# Patient Record
Sex: Female | Born: 1990 | Race: Black or African American | Hispanic: No | Marital: Single | Smoking: Current every day smoker
Health system: Southern US, Community
[De-identification: ages and names within clinical notes are randomized; demographics above are authoritative.]

## PROBLEM LIST (undated history)

## (undated) DIAGNOSIS — A5901 Trichomonal vulvovaginitis: Secondary | ICD-10-CM

## (undated) DIAGNOSIS — IMO0002 Reserved for concepts with insufficient information to code with codable children: Secondary | ICD-10-CM

## (undated) DIAGNOSIS — A546 Gonococcal infection of anus and rectum: Secondary | ICD-10-CM

## (undated) DIAGNOSIS — A749 Chlamydial infection, unspecified: Secondary | ICD-10-CM

## (undated) HISTORY — DX: Trichomonal vulvovaginitis: A59.01

## (undated) HISTORY — DX: Gonococcal infection of anus and rectum: A54.6

## (undated) HISTORY — DX: Chlamydial infection, unspecified: A74.9

## (undated) HISTORY — PX: CERVICAL CERCLAGE: SHX1329

## (undated) HISTORY — DX: Reserved for concepts with insufficient information to code with codable children: IMO0002

---
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
## 2010-08-13 DIAGNOSIS — A749 Chlamydial infection, unspecified: Secondary | ICD-10-CM

## 2010-08-13 HISTORY — DX: Chlamydial infection, unspecified: A74.9

## 2010-11-14 ENCOUNTER — Ambulatory Visit (INDEPENDENT_AMBULATORY_CARE_PROVIDER_SITE_OTHER): Payer: Medicaid Other | Admitting: Family Medicine

## 2010-11-14 ENCOUNTER — Other Ambulatory Visit: Payer: Self-pay | Admitting: Obstetrics & Gynecology

## 2010-11-14 ENCOUNTER — Encounter: Payer: Self-pay | Admitting: Gynecology

## 2010-11-14 VITALS — BP 102/70 | Wt 129.0 lb

## 2010-11-14 DIAGNOSIS — Z3400 Encounter for supervision of normal first pregnancy, unspecified trimester: Secondary | ICD-10-CM

## 2010-11-15 LAB — OBSTETRIC PANEL
Antibody Screen: NEGATIVE
Basophils Absolute: 0 10*3/uL (ref 0.0–0.1)
Basophils Relative: 0 % (ref 0–1)
Eosinophils Absolute: 0.1 10*3/uL (ref 0.0–0.7)
Eosinophils Relative: 1 % (ref 0–5)
HCT: 43.4 % (ref 36.0–46.0)
Hemoglobin: 14.5 g/dL (ref 12.0–15.0)
Hepatitis B Surface Ag: NEGATIVE
Lymphocytes Relative: 16 % (ref 12–46)
Lymphs Abs: 2.7 10*3/uL (ref 0.7–4.0)
MCH: 33.8 pg (ref 26.0–34.0)
MCHC: 33.4 g/dL (ref 30.0–36.0)
MCV: 101.2 fL — ABNORMAL HIGH (ref 78.0–100.0)
Monocytes Absolute: 0.7 10*3/uL (ref 0.1–1.0)
Monocytes Relative: 4 % (ref 3–12)
Neutro Abs: 13.5 10*3/uL — ABNORMAL HIGH (ref 1.7–7.7)
Neutrophils Relative %: 79 % — ABNORMAL HIGH (ref 43–77)
Platelets: 273 10*3/uL (ref 150–400)
RBC: 4.29 MIL/uL (ref 3.87–5.11)
RDW: 13.5 % (ref 11.5–15.5)
Rh Type: POSITIVE
Rubella: 24.3 IU/mL — ABNORMAL HIGH
WBC: 17.1 10*3/uL — ABNORMAL HIGH (ref 4.0–10.5)

## 2010-11-15 LAB — GC/CHLAMYDIA PROBE AMP, URINE
Chlamydia, Swab/Urine, PCR: NEGATIVE
GC Probe Amp, Urine: NEGATIVE

## 2010-11-15 LAB — SICKLE CELL SCREEN: Sickle Cell Screen: NEGATIVE

## 2010-11-15 LAB — HIV ANTIBODY (ROUTINE TESTING W REFLEX): HIV: NONREACTIVE

## 2010-11-16 LAB — URINE CULTURE: Colony Count: 6000

## 2010-11-19 ENCOUNTER — Ambulatory Visit (HOSPITAL_COMMUNITY)
Admission: RE | Admit: 2010-11-19 | Discharge: 2010-11-19 | Disposition: A | Discharge status: Home / Self Care | Payer: Medicaid Other | Source: Ambulatory Visit | Attending: Obstetrics & Gynecology | Admitting: Obstetrics & Gynecology

## 2010-11-19 DIAGNOSIS — Z3400 Encounter for supervision of normal first pregnancy, unspecified trimester: Secondary | ICD-10-CM

## 2010-11-19 DIAGNOSIS — Z3689 Encounter for other specified antenatal screening: Secondary | ICD-10-CM | POA: Insufficient documentation

## 2010-11-26 ENCOUNTER — Emergency Department (HOSPITAL_COMMUNITY)
Admission: EM | Admit: 2010-11-26 | Discharge: 2010-11-26 | Disposition: A | Discharge status: Home / Self Care | Payer: Medicaid Other | Attending: Emergency Medicine | Admitting: Emergency Medicine

## 2010-11-26 ENCOUNTER — Emergency Department (HOSPITAL_COMMUNITY): Payer: Medicaid Other

## 2010-11-26 DIAGNOSIS — M25559 Pain in unspecified hip: Secondary | ICD-10-CM | POA: Insufficient documentation

## 2010-11-26 DIAGNOSIS — O99891 Other specified diseases and conditions complicating pregnancy: Secondary | ICD-10-CM | POA: Insufficient documentation

## 2010-11-27 ENCOUNTER — Encounter: Payer: Self-pay | Admitting: Obstetrics & Gynecology

## 2010-11-27 ENCOUNTER — Other Ambulatory Visit: Payer: Self-pay | Admitting: Obstetrics & Gynecology

## 2010-11-27 ENCOUNTER — Ambulatory Visit (INDEPENDENT_AMBULATORY_CARE_PROVIDER_SITE_OTHER): Payer: Medicaid Other | Admitting: Obstetrics & Gynecology

## 2010-11-27 VITALS — BP 102/70 | Wt 127.5 lb

## 2010-11-27 DIAGNOSIS — A499 Bacterial infection, unspecified: Secondary | ICD-10-CM

## 2010-11-27 DIAGNOSIS — Z3400 Encounter for supervision of normal first pregnancy, unspecified trimester: Secondary | ICD-10-CM

## 2010-11-27 DIAGNOSIS — IMO0002 Reserved for concepts with insufficient information to code with codable children: Secondary | ICD-10-CM

## 2010-11-27 DIAGNOSIS — B9689 Other specified bacterial agents as the cause of diseases classified elsewhere: Secondary | ICD-10-CM

## 2010-11-27 DIAGNOSIS — Z3682 Encounter for antenatal screening for nuchal translucency: Secondary | ICD-10-CM

## 2010-11-27 DIAGNOSIS — N760 Acute vaginitis: Secondary | ICD-10-CM

## 2010-11-27 NOTE — Patient Instructions (Signed)
Pregnancy - First Trimester During sexual intercourse, millions of sperm go into the vagina. Only 1 sperm will penetrate and fertilize the female egg while it is in the Fallopian tube. One week later, the fertilized egg implants into the wall of the uterus. An embryo begins to develop into a baby. At 6 to 8 weeks, the eyes and face are formed and the heartbeat can be seen on ultrasound. At the end of 12 weeks (first trimester), all the baby's organs are formed. Now that you are pregnant, you will want to do everything you can to have a healthy baby. Two of the most important things are to get good prenatal care and follow your caregiver's instructions. Prenatal care is all the medical care you receive before the baby's birth. It is given to prevent, find and treat problems during the pregnancy and childbirth. PRENATAL EXAMS:  During prenatal visits, your weight, blood pressure and urine are checked. This is done to make sure you are healthy and progressing normally during the pregnancy.   A pregnant woman should gain 25 to 35 pounds during the pregnancy. However, if you are over weight or underweight, your caregiver will advise you regarding your weight.   Your caregiver will ask and answer questions for you.   Blood work, cervical cultures, other necessary tests and a Pap test are done during your prenatal exams. These tests are done to check on your health and the probable health of your baby. Tests are strongly recommended and done for HIV with your permission. This is the virus that causes AIDS. These tests are done because medications can be given to help prevent your baby from being born with this infection should you have been infected without knowing it. Blood work is also used to find out your blood type, previous infections and follow your blood levels (hemoglobin).   Low hemoglobin (anemia) is common during pregnancy. Iron and vitamins are given to help prevent this. Later in the pregnancy,  blood tests for diabetes will be done along with any other tests if any problems develop. You may need tests to make sure you and the baby are doing well.   You may need other tests to make sure you and the baby are doing well.  CHANGES DURING THE FIRST TRIMESTER (THE FIRST 3 MONTHS OF PREGNANCY) Your body goes through many changes during pregnancy. They vary from person to person. Talk to your caregiver about changes you notice and are concerned about. Changes can include:  Your menstrual period stops.   The egg and sperm carry the genes that determine what you look like. Genes from you and your partner are forming a baby. The female genes determine whether the baby is a boy or a girl.   Your body increases in girth and you may feel bloated.   Feeling sick to your stomach (nauseous) and throwing up (vomiting). If the vomiting is uncontrollable, call your caregiver.   Your breasts will begin to enlarge and become tender.   Your nipples may stick out more and become darker.   The need to urinate more. Painful urination may mean you have a bladder infection.   Tiring easily.   Loss of appetite.   Cravings for certain kinds of food.   At first, you may gain or lose a couple of pounds.   You may have changes in your emotions from day to day (excited to be pregnant or concerned something may go wrong with the pregnancy and baby).     You may have more vivid and strange dreams.  HOME CARE INSTRUCTIONS  It is very important to avoid all smoking, alcohol and un-prescribed drugs during your pregnancy. These affect the formation and growth of the baby. Avoid chemicals while pregnant to ensure the delivery of a healthy infant.   Start your prenatal visits by the 12th week of pregnancy. They are usually scheduled monthly at first, then more often in the last 2 months before delivery. Keep your caregiver's appointments. Follow your caregiver's instructions regarding medication use, blood and lab  tests, exercise, and diet.   During pregnancy, you are providing food for you and your baby. Eat regular, well-balanced meals. Choose foods such as meat, fish, milk and other low fat dairy products, vegetables, fruits, and whole-grain breads and cereals. Your caregiver will tell you of the ideal weight gain.   You can help morning sickness by keeping soda crackers (saltines) at the bedside. Eat a couple before arising in the morning. You may want to use the crackers without salt on them.   Eating 4 to 5 small meals rather than 3 large meals a day also may help the nausea and vomiting.   Drinking liquids between meals instead of during meals also seems to help nausea and vomiting.   A physical sexual relationship may be continued throughout pregnancy if there are no other problems. Problems may be early (premature) leaking of amniotic fluid from the membranes, vaginal bleeding, or belly (abdominal) pain.   Exercise regularly if there are no restrictions. Check with your caregiver or physical therapist if you are unsure of the safety of some of your exercises. Greater weight gain will occur in the last 2 trimesters of pregnancy. Exercising will help:   Control your weight.   Keep you in shape.   Prepare you for labor and delivery.   Help you lose your pregnancy weight after you deliver your baby.   Wear a good support or jogging bra for breast tenderness during pregnancy. This may help if worn during sleep too.   Ask when prenatal classes are available. Begin classes when they are offered.   Do not use hot tubs, steam rooms or saunas.   Wear your seat belt when driving. This protects you and your baby if you are in an accident.   Avoid raw meat, uncooked cheese, cat litter boxes and soil used by cats throughout the pregnancy. These carry germs that can cause birth defects in the baby.   The first trimester is a good time to visit your dentist for your dental health. Getting your teeth  cleaned is OK. Use a softer toothbrush and brush gently during pregnancy.   Ask for help if you have financial, counseling or nutritional needs during pregnancy. Your caregiver will be able to offer counseling for these needs as well as refer you for other special needs.   Do not take any medications or herbs unless told by your caregiver.   Inform your caregiver if there is any mental or physical domestic violence.   Make a list of emergency phone numbers of family, friends, hospital, police and fire department.   Write down your questions. Take them to your prenatal visit.   Do not douche.   Do not cross your legs.   If you have to stand for long periods of time, rotate you feet or take small steps in a circle.   You may have more vaginal secretions that may require a sanitary pad. Do not use tampons   or scented sanitary pads.  MEDICATIONS AND DRUG USE IN PREGNANCY  Take prenatal vitamins as directed. The vitamin should contain 1 milligram of folic acid. Keep all vitamins out of reach of children. Only a couple vitamins or tablets containing iron may be fatal to a baby or young child when ingested.   Avoid use of all medications, including herbs, over-the-counter medications, not prescribed or suggested by your caregiver. Only take over-the-counter or prescription medicines for pain, discomfort, or fever as directed by your caregiver. Do not use aspirin, ibuprofen (Motrin, Advil, Nuprin) or naproxen (Aleve) unless OK'd by your caregiver.   Let your caregiver also know about herbs you may be using.   Alcohol is related to a number of birth defects. This includes fetal alcohol syndrome. All alcohol, in any form, should be avoided completely. Smoking will cause low birth rate and premature babies.   Street/illegal drugs are very harmful to the baby. They are absolutely forbidden. A baby born to an addicted mother will be addicted at birth. The baby will go through the same withdrawal  an adult does.   Let your caregiver know about any medications that you have to take and for what reason you take them.  MISCARRIAGE IS COMMON DURING PREGNANCY A miscarriage does not mean you did something wrong. It is not a reason to worry about getting pregnant again. Your caregiver will help you with questions you may have. If you have a miscarriage, you may need minor surgery (a D & C). SEEK MEDICAL CARE IF:  You have any concerns or worries during your pregnancy. It is better to call with your questions if you feel they cannot wait, rather than worry about them.  SEEK IMMEDIATE MEDICAL CARE IF:  An unexplained oral temperature above 100.4 develops, or as your caregiver suggests.   You have leaking of fluid from the vagina (birth canal). If leaking membranes are suspected, take your temperature and inform your caregiver of this when you call.   There is vaginal spotting or bleeding. Notify your caregiver of the amount and how many pads are used.   You develop a bad smelling vaginal discharge with a change in the color.   You continue to feel sick to your stomach (nauseated) and have no relief from remedies suggested. You vomit blood or coffee ground like materials.   You lose more than 2 pounds of weight in one week.   You gain more than 2 pounds of weight in a week and you notice swelling of your face, hands, feet or legs.   You gain 5 pounds or more in 1 week (even if you do not have swelling of your hands, face, legs or feet).   You get exposed to German measles and have never had them.   You are exposed to fifth disease or chicken pox.   You develop belly (abdominal) pain. Round ligament discomfort is a common non-cancerous (benign) cause of abdominal pain in pregnancy. Your caregiver still must evaluate this.   You develop headache, fever, diarrhea, pain with urination, or shortness of breath.   You fall, are in a car accident or have any kind of trauma.   There is mental  or physical violence in your home.  Document Released: 03/25/2001 Document Re-Released: 09/18/2009 [employer] Patient Information 2011 [employer]. Pregnancy - Second Trimester The second trimester of pregnancy (3 to 6 months) is a period of rapid growth for you and your baby. At the end of the sixth month, your   baby is about 9 inches long and weighs 1 1/2 pounds. You will begin to feel the baby move between 18 and 20 weeks of the pregnancy. This is called quickening. Weight gain is faster. A clear fluid (colostrum) may leak out of your breasts. You may feel small contractions of the womb (uterus). This is known as false labor or Braxton-Hicks contractions. This is like a practice for labor when the baby is ready to be born. Usually, the problems with morning sickness have usually passed by the end of your first trimester. Some women develop small dark blotches (called cholasma, mask of pregnancy) on their face that usually goes away after the baby is born. Exposure to the sun makes the blotches worse. Acne may also develop in some pregnant women and pregnant women who have acne, may find that it goes away. PRENATAL EXAMS  Blood work may continue to be done during prenatal exams. These tests are done to check on your health and the probable health of your baby. Blood work is used to follow your blood levels (hemoglobin). Anemia (low hemoglobin) is common during pregnancy. Iron and vitamins are given to help prevent this. You will also be checked for diabetes between 24 and 28 weeks of the pregnancy. Some of the previous blood tests may be repeated.   The size of the uterus is measured during each visit. This is to make sure that the baby is continuing to grow properly according to the dates of the pregnancy.   Your blood pressure is checked every prenatal visit. This is to make sure you are not getting toxemia.   Your urine is checked to make sure you do not have an infection, diabetes or protein in  the urine.   Your weight is checked often to make sure gains are happening at the suggested rate. This is to ensure that both you and your baby are growing normally.   Sometimes, an ultrasound is performed to confirm the proper growth and development of the baby. This is a test which bounces harmless sound waves off the baby so your caregiver can more accurately determine due dates.  Sometimes, a specialized test is done on the amniotic fluid surrounding the baby. This test is called an amniocentesis. The amniotic fluid is obtained by sticking a needle into the belly (abdomen). This is done to check the chromosomes in instances where there is a concern about possible genetic problems with the baby. It is also sometimes done near the end of pregnancy if an early delivery is required. In this case, it is done to help make sure the baby's lungs are mature enough for the baby to live outside of the womb. CHANGES OCCURING IN THE SECOND TRIMESTER OF PREGNANCY Your body goes through many changes during pregnancy. They vary from person to person. Talk to your caregiver about changes you notice that you are concerned about.  During the second trimester, you will likely have an increase in your appetite. It is normal to have cravings for certain foods. This varies from person to person and pregnancy to pregnancy.   Your lower abdomen will begin to bulge.   You may have to urinate more often because the uterus and baby are pressing on your bladder. It is also common to get more bladder infections during pregnancy (pain with urination). You can help this by drinking lots of fluids and emptying your bladder before and after intercourse.   You may begin to get stretch marks on your hips,   abdomen, and breasts. These are normal changes in the body during pregnancy. There are no exercises or medications to take that prevent this change.   You may begin to develop swollen and bulging veins (varicose veins) in your  legs. Wearing support hose, elevating your feet for 15 minutes, 3 to 4 times a day and limiting salt in your diet helps lessen the problem.   Heartburn may develop as the uterus grows and pushes up against the stomach. Antacids recommended by your caregiver helps with this problem. Also, eating smaller meals 4 to 5 times a day helps.   Constipation can be treated with a stool softener or adding bulk to your diet. Drinking lots of fluids, vegetables, fruits, and whole grains are helpful.   Exercising is also helpful. If you have been very active up until your pregnancy, most of these activities can be continued during your pregnancy. If you have been less active, it is helpful to start an exercise program such as walking.   Hemorrhoids (varicose veins in the rectum) may develop at the end of the second trimester. Warm sitz baths and hemorrhoid cream recommended by your caregiver helps hemorrhoid problems.   Backaches may develop during this time of your pregnancy. Avoid heavy lifting, wear low heal shoes and practice good posture to help with backache problems.   Some pregnant women develop tingling and numbness of their hand and fingers because of swelling and tightening of ligaments in the wrist (carpel tunnel syndrome). This goes away after the baby is born.   As your breasts enlarge, you may have to get a bigger bra. Get a comfortable, cotton, support bra. Do not get a nursing bra until the last month of the pregnancy if you will be nursing the baby.   You may get a dark line from your belly button to the pubic area called the linea nigra.   You may develop rosy cheeks because of increase blood flow to the face.   You may develop spider looking lines of the face, neck, arms and chest. These go away after the baby is born.  HOME CARE INSTRUCTIONS  It is extremely important to avoid all smoking, herbs, alcohol, and un-prescribed drugs during your pregnancy. These chemicals affect the  formation and growth of the baby. Avoid these chemicals throughout the pregnancy to ensure the delivery of a healthy infant.   Most of your home care instructions are the same as suggested for the first trimester of your pregnancy. Keep your caregiver's appointments. Follow your caregiver's instructions regarding medication use, exercise and diet.   During pregnancy, you are providing food for you and your baby. Continue to eat regular, well-balanced meals. Choose foods such as meat, fish, milk and other low fat dairy products, vegetables, fruits, and whole-grain breads and cereals. Your caregiver will tell you of the ideal weight gain.   A physical sexual relationship may be continued up until near the end of pregnancy if there are no other problems. Problems could include early (premature) leaking of amniotic fluid from the membranes, vaginal bleeding, abdominal pain, or other medical or pregnancy problems.   Exercise regularly if there are no restrictions. Check with your caregiver if you are unsure of the safety of some of your exercises. The greatest weight gain will occur in the last 2 trimesters of pregnancy. Exercise will help you:   Control your weight.   Get you in shape for labor and delivery.   Lose weight after you have the baby.     Wear a good support or jogging bra for breast tenderness during pregnancy. This may help if worn during sleep. Pads or tissues may be used in the bra if you are leaking colostrum.   Do not use hot tubs, steam rooms or saunas throughout the pregnancy.   Wear your seat belt at all times when driving. This protects you and your baby if you are in an accident.   Avoid raw meat, uncooked cheese, cat litter boxes and soil used by cats. These carry germs that can cause birth defects in the baby.   The second trimester is also a good time to visit your dentist for your dental health if this has not been done yet. Getting your teeth cleaned is OK. Use a soft  toothbrush. Brush gently during pregnancy.   It is easier to loose urine during pregnancy. Tightening up and strengthening the pelvic muscles will help with this problem. Practice stopping your urination while you are going to the bathroom. These are the same muscles you need to strengthen. It is also the muscles you would use as if you were trying to stop from passing gas. You can practice tightening these muscles up 10 times a set and repeating this about 3 times per day. Once you know what muscles to tighten up, do not perform these exercises during urination. It is more likely to contribute to an infection by backing up the urine.   Ask for help if you have financial, counseling or nutritional needs during pregnancy. Your caregiver will be able to offer counseling for these needs as well as refer you for other special needs.   Your skin may become oily. If so, wash your face with mild soap, use non-greasy moisturizer and oil or cream based makeup.  MEDICATIONS AND DRUG USE IN PREGNANCY  Take prenatal vitamins as directed. The vitamin should contain 1 milligram of folic acid. Keep all vitamins out of reach of children. Only a couple vitamins or tablets containing iron may be fatal to a baby or young child when ingested.   Avoid use of all medications, including herbs, over-the-counter medications, not prescribed or suggested by your caregiver. Only take over-the-counter or prescription medicines for pain, discomfort, or fever as directed by your caregiver. Do not use aspirin.   Let your caregiver also know about herbs you may be using.   Alcohol is related to a number of birth defects. This includes fetal alcohol syndrome. All alcohol, in any form, should be avoided completely. Smoking will cause low birth rate and premature babies.   Street/illegal drugs are very harmful to the baby. They are absolutely forbidden. A baby born to an addicted mother will be addicted at birth. The baby will go  through the same withdrawal an adult does.  SEEK MEDICAL CARE IF:  You have any concerns or worries during your pregnancy. It is better to call with your questions if you feel they cannot wait, rather than worry about them.  SEEK IMMEDIATE MEDICAL CARE IF:  An unexplained oral temperature above 100.4 develops, or as your caregiver suggests.   You have leaking of fluid from the vagina (birth canal). If leaking membranes are suspected, take your temperature and tell your caregiver of this when you call.   There is vaginal spotting, bleeding, or passing clots. Tell your caregiver of the amount and how many pads are used. Light spotting in pregnancy is common, especially following intercourse.   You develop a bad smelling vaginal discharge with a change   in the color from clear to white.   You continue to feel sick to your stomach (nauseated) and have no relief from remedies suggested. You vomit blood or coffee ground like materials.   You loose more than 2 pounds of weight or gain more than 2 pounds of weight over a weeks time, or as suggested by your caregiver.   You notice swelling of your face, hands, and feet or legs.   You get exposed to German measles and have never had them.   You are exposed to fifth disease or chicken pox.   You develop belly (abdominal) pain. Round ligament discomfort is a common non-cancerous (benign) cause of abdominal pain in pregnancy. Your caregiver still must evaluate you.   You develop a bad headache that does not go away.   You develop fever, diarrhea, pain with urination or shortness of breath.   You develop visual problems, blurry or double vision.   You fall, are in a car accident or any kind of trauma.   There is mental or physical violence at home.  Document Released: 03/25/2001 Document Re-Released: 06/25/2009 [employer] Patient Information 2011 [employer]. 
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
## 2010-11-28 LAB — GC/CHLAMYDIA PROBE AMP, GENITAL
Chlamydia, DNA Probe: NEGATIVE
GC Probe Amp, Genital: NEGATIVE

## 2010-11-28 LAB — WET PREP, GENITAL

## 2010-11-28 MED ORDER — METRONIDAZOLE 500 MG PO TABS: 500.0000 mg | ORAL_TABLET | Freq: Two times a day (BID) | ORAL | Status: AC

## 2010-11-28 NOTE — Progress Notes (Signed)
Addended by: Jaynie Collins A on: 11/28/2010 02:17 PM   Modules accepted: Orders

## 2010-11-29 ENCOUNTER — Ambulatory Visit (HOSPITAL_COMMUNITY)
Admission: RE | Admit: 2010-11-29 | Discharge: 2010-11-29 | Disposition: A | Discharge status: Home / Self Care | Payer: Medicaid Other | Source: Ambulatory Visit | Attending: Obstetrics & Gynecology | Admitting: Obstetrics & Gynecology

## 2010-11-29 ENCOUNTER — Encounter (HOSPITAL_COMMUNITY): Payer: Self-pay

## 2010-11-29 ENCOUNTER — Other Ambulatory Visit: Payer: Self-pay | Admitting: Obstetrics & Gynecology

## 2010-11-29 ENCOUNTER — Ambulatory Visit (HOSPITAL_COMMUNITY): Admission: RE | Admit: 2010-11-29 | Payer: Medicaid Other | Source: Ambulatory Visit

## 2010-11-29 DIAGNOSIS — O351XX0 Maternal care for (suspected) chromosomal abnormality in fetus, not applicable or unspecified: Secondary | ICD-10-CM | POA: Insufficient documentation

## 2010-11-29 DIAGNOSIS — O3510X0 Maternal care for (suspected) chromosomal abnormality in fetus, unspecified, not applicable or unspecified: Secondary | ICD-10-CM | POA: Insufficient documentation

## 2010-11-29 DIAGNOSIS — Z3682 Encounter for antenatal screening for nuchal translucency: Secondary | ICD-10-CM

## 2010-11-29 DIAGNOSIS — Z3689 Encounter for other specified antenatal screening: Secondary | ICD-10-CM | POA: Insufficient documentation

## 2010-11-29 NOTE — Progress Notes (Signed)
Patient seen for ultrasound only appointment today.  Please see [facility]-OBGYN report for details.  

## 2010-12-03 ENCOUNTER — Other Ambulatory Visit: Payer: Self-pay | Admitting: Maternal and Fetal Medicine

## 2010-12-03 ENCOUNTER — Ambulatory Visit (HOSPITAL_COMMUNITY)
Admission: RE | Admit: 2010-12-03 | Discharge: 2010-12-03 | Disposition: A | Discharge status: Home / Self Care | Payer: Medicaid Other | Source: Ambulatory Visit | Attending: Obstetrics & Gynecology | Admitting: Obstetrics & Gynecology

## 2010-12-03 DIAGNOSIS — O3510X0 Maternal care for (suspected) chromosomal abnormality in fetus, unspecified, not applicable or unspecified: Secondary | ICD-10-CM | POA: Insufficient documentation

## 2010-12-03 DIAGNOSIS — O351XX0 Maternal care for (suspected) chromosomal abnormality in fetus, not applicable or unspecified: Secondary | ICD-10-CM | POA: Insufficient documentation

## 2010-12-03 DIAGNOSIS — Z3682 Encounter for antenatal screening for nuchal translucency: Secondary | ICD-10-CM

## 2010-12-03 NOTE — Progress Notes (Signed)
Patient seen for ultrasound only appointment today.  Please see [facility]-OBGYN report for details.  

## 2010-12-09 ENCOUNTER — Inpatient Hospital Stay (HOSPITAL_COMMUNITY)
Admission: AD | Admit: 2010-12-09 | Discharge: 2010-12-09 | Disposition: A | Discharge status: Home / Self Care | Payer: Medicaid Other | Source: Ambulatory Visit | Attending: Obstetrics & Gynecology | Admitting: Obstetrics & Gynecology

## 2010-12-09 ENCOUNTER — Encounter (HOSPITAL_COMMUNITY): Payer: Self-pay

## 2010-12-09 DIAGNOSIS — O2390 Unspecified genitourinary tract infection in pregnancy, unspecified trimester: Secondary | ICD-10-CM | POA: Insufficient documentation

## 2010-12-09 DIAGNOSIS — IMO0002 Reserved for concepts with insufficient information to code with codable children: Secondary | ICD-10-CM

## 2010-12-09 DIAGNOSIS — O98919 Unspecified maternal infectious and parasitic disease complicating pregnancy, unspecified trimester: Secondary | ICD-10-CM

## 2010-12-09 DIAGNOSIS — N72 Inflammatory disease of cervix uteri: Secondary | ICD-10-CM | POA: Insufficient documentation

## 2010-12-09 LAB — URINALYSIS, ROUTINE W REFLEX MICROSCOPIC
Glucose, UA: NEGATIVE mg/dL
Ketones, ur: NEGATIVE mg/dL
Leukocytes, UA: NEGATIVE
Protein, ur: NEGATIVE mg/dL
Urobilinogen, UA: 0.2 mg/dL (ref 0.0–1.0)

## 2010-12-09 LAB — WET PREP, GENITAL: Trich, Wet Prep: NONE SEEN

## 2010-12-09 MED ORDER — AZITHROMYCIN 250 MG PO TABS
1000.0000 mg | ORAL_TABLET | Freq: Once | ORAL | Status: AC
  Administered 2010-12-09: 1000 mg via ORAL
  Filled 2010-12-09: qty 4

## 2010-12-09 NOTE — Progress Notes (Signed)
Pt states she has been having vaginal discomfort since 8-22. Slight yellow vaginal d/c.
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
## 2010-12-31 ENCOUNTER — Encounter: Payer: Medicaid Other | Admitting: Obstetrics & Gynecology

## 2010-12-31 ENCOUNTER — Ambulatory Visit (INDEPENDENT_AMBULATORY_CARE_PROVIDER_SITE_OTHER): Payer: Medicaid Other | Admitting: Obstetrics & Gynecology

## 2010-12-31 VITALS — BP 100/66 | Wt 132.0 lb

## 2010-12-31 DIAGNOSIS — Z3480 Encounter for supervision of other normal pregnancy, unspecified trimester: Secondary | ICD-10-CM

## 2010-12-31 NOTE — Progress Notes (Signed)
No complaints.  Needs pregnancy work restrictions letter; letter given to patient.  Anatomy scan to be scheduled.  RTC 4 weeks.

## 2011-01-07 ENCOUNTER — Other Ambulatory Visit: Payer: Self-pay | Admitting: Obstetrics & Gynecology

## 2011-01-07 ENCOUNTER — Encounter: Payer: Self-pay | Admitting: Obstetrics & Gynecology

## 2011-01-07 ENCOUNTER — Ambulatory Visit (HOSPITAL_COMMUNITY)
Admission: RE | Admit: 2011-01-07 | Discharge: 2011-01-07 | Disposition: A | Discharge status: Home / Self Care | Payer: Medicaid Other | Source: Ambulatory Visit | Attending: Obstetrics & Gynecology | Admitting: Obstetrics & Gynecology

## 2011-01-07 DIAGNOSIS — Z1389 Encounter for screening for other disorder: Secondary | ICD-10-CM | POA: Insufficient documentation

## 2011-01-07 DIAGNOSIS — Z3480 Encounter for supervision of other normal pregnancy, unspecified trimester: Secondary | ICD-10-CM

## 2011-01-07 DIAGNOSIS — Z363 Encounter for antenatal screening for malformations: Secondary | ICD-10-CM | POA: Insufficient documentation

## 2011-01-07 DIAGNOSIS — O26879 Cervical shortening, unspecified trimester: Secondary | ICD-10-CM | POA: Insufficient documentation

## 2011-01-07 DIAGNOSIS — O358XX0 Maternal care for other (suspected) fetal abnormality and damage, not applicable or unspecified: Secondary | ICD-10-CM | POA: Insufficient documentation

## 2011-01-07 MED ORDER — PROGESTERONE 200 MG VA SUPP: 200.0000 mg | Freq: Every day | VAGINAL | Status: DC

## 2011-01-07 NOTE — Progress Notes (Signed)
20 year old G1P0 at [redacted]w[redacted]d seen in ultrasound today for her anatomy scan.  Normal fetal anatomy noted, however, she was found to have a shortened cervix with marked funneling at the internal os.  Cervical length was 1.6 cm, funnel length 3.6 cm and width 2.2 cm.  Normal AFV.  Patient denied any contractions, VB, LOF or any concerning symptoms.  Patient was counseled regarding management of the short cervix.  Discussed cervical cerclage and/or vaginal progesterone.  Risks and benefits of both modalities were discussed in detail.  Patient opted for both modes of treatment.  Cerclage scheduled on 01/08/11 at 10:30 am with Dr. Shawnie Pons.  Routine preoperative instructions of having nothing to eat or drink after midnight on the day prior to surgery and also coming to the hospital 1 1/2 hours prior to her time of surgery were emphasized. The progesterone was e-prescribed to her pharmacy on file; work restrictions were emphasized.  A note was sent to her work place requesting excuse from work secondary to needing surgery.  Preterm labor precautions reviewed.  Kristen Coffey A 01/07/2011 5:48 PM

## 2011-01-08 ENCOUNTER — Encounter (HOSPITAL_COMMUNITY): Payer: Self-pay | Admitting: Anesthesiology

## 2011-01-08 ENCOUNTER — Ambulatory Visit (HOSPITAL_COMMUNITY)
Admission: RE | Admit: 2011-01-08 | Discharge: 2011-01-08 | Disposition: A | Discharge status: Home / Self Care | Payer: Medicaid Other | Source: Ambulatory Visit | Attending: Family Medicine | Admitting: Family Medicine

## 2011-01-08 ENCOUNTER — Encounter (HOSPITAL_COMMUNITY)
Admission: RE | Disposition: A | Discharge status: Home / Self Care | Payer: Self-pay | Source: Ambulatory Visit | Attending: Family Medicine

## 2011-01-08 ENCOUNTER — Encounter (HOSPITAL_COMMUNITY): Payer: Self-pay | Admitting: *Deleted

## 2011-01-08 ENCOUNTER — Encounter (HOSPITAL_COMMUNITY): Payer: Self-pay | Admitting: Family Medicine

## 2011-01-08 ENCOUNTER — Ambulatory Visit (HOSPITAL_COMMUNITY): Payer: Medicaid Other | Admitting: Anesthesiology

## 2011-01-08 DIAGNOSIS — Z3480 Encounter for supervision of other normal pregnancy, unspecified trimester: Secondary | ICD-10-CM

## 2011-01-08 DIAGNOSIS — O3430 Maternal care for cervical incompetence, unspecified trimester: Secondary | ICD-10-CM | POA: Insufficient documentation

## 2011-01-08 DIAGNOSIS — O26879 Cervical shortening, unspecified trimester: Secondary | ICD-10-CM | POA: Insufficient documentation

## 2011-01-08 HISTORY — PX: CERVICAL CERCLAGE: SHX1329

## 2011-01-08 LAB — CBC
HCT: 36.3 % (ref 36.0–46.0)
Hemoglobin: 12.2 g/dL (ref 12.0–15.0)
RBC: 3.73 MIL/uL — ABNORMAL LOW (ref 3.87–5.11)
WBC: 10.4 10*3/uL (ref 4.0–10.5)

## 2011-01-08 LAB — TYPE AND SCREEN: Antibody Screen: NEGATIVE

## 2011-01-08 LAB — ABO/RH: ABO/RH(D): O POS

## 2011-01-08 SURGERY — CERCLAGE, CERVIX, VAGINAL APPROACH
Anesthesia: Spinal | Site: Vagina | Wound class: Clean Contaminated

## 2011-01-08 MED ORDER — PHENYLEPHRINE HCL 10 MG/ML IJ SOLN
INTRAMUSCULAR | Status: DC | PRN
  Administered 2011-01-08 (×2): 40 ug via INTRAVENOUS

## 2011-01-08 MED ORDER — INDOMETHACIN 50 MG PO CAPS: 50.0000 mg | ORAL_CAPSULE | Freq: Four times a day (QID) | ORAL | Status: DC

## 2011-01-08 MED ORDER — METOCLOPRAMIDE HCL 5 MG/ML IJ SOLN: 10.0000 mg | Freq: Once | INTRAMUSCULAR | Status: DC | PRN

## 2011-01-08 MED ORDER — PROGESTERONE 200 MG VA SUPP: 200.0000 mg | Freq: Every day | VAGINAL | Status: DC

## 2011-01-08 MED ORDER — DEXTROSE 5 % IV SOLN
1.0000 g | INTRAVENOUS | Status: AC
  Administered 2011-01-08: 1 g via INTRAVENOUS
  Filled 2011-01-08: qty 1

## 2011-01-08 MED ORDER — BUPIVACAINE IN DEXTROSE 0.75-8.25 % IT SOLN
INTRATHECAL | Status: DC | PRN
  Administered 2011-01-08: 1.4 mL via INTRATHECAL

## 2011-01-08 MED ORDER — EPHEDRINE 5 MG/ML INJ
INTRAVENOUS | Status: AC
  Filled 2011-01-08: qty 10

## 2011-01-08 MED ORDER — FENTANYL CITRATE 0.05 MG/ML IJ SOLN: 25.0000 ug | INTRAMUSCULAR | Status: DC | PRN

## 2011-01-08 MED ORDER — MEPERIDINE HCL 25 MG/ML IJ SOLN: 6.2500 mg | INTRAMUSCULAR | Status: DC | PRN

## 2011-01-08 MED ORDER — PHENYLEPHRINE 40 MCG/ML (10ML) SYRINGE FOR IV PUSH (FOR BLOOD PRESSURE SUPPORT)
PREFILLED_SYRINGE | INTRAVENOUS | Status: AC
  Filled 2011-01-08: qty 5

## 2011-01-08 MED ORDER — LACTATED RINGERS IV SOLN
INTRAVENOUS | Status: DC
  Administered 2011-01-08: 1000 mL via INTRAVENOUS
  Administered 2011-01-08: 10:00:00 via INTRAVENOUS

## 2011-01-08 MED ORDER — FENTANYL CITRATE 0.05 MG/ML IJ SOLN
INTRAMUSCULAR | Status: AC
  Filled 2011-01-08: qty 5

## 2011-01-08 MED ORDER — EPHEDRINE SULFATE 50 MG/ML IJ SOLN
INTRAMUSCULAR | Status: DC | PRN
  Administered 2011-01-08: 5 mg via INTRAVENOUS
  Administered 2011-01-08: 10 mg via INTRAVENOUS

## 2011-01-08 SURGICAL SUPPLY — 18 items
CATH ROBINSON RED A/P 16FR (CATHETERS) IMPLANT
CLOTH BEACON ORANGE TIMEOUT ST (SAFETY) ×2 IMPLANT
COUNTER NEEDLE 1200 MAGNETIC (NEEDLE) IMPLANT
GLOVE BIOGEL PI IND STRL 7.0 (GLOVE) ×1 IMPLANT
GLOVE BIOGEL PI INDICATOR 7.0 (GLOVE) ×1
GLOVE ECLIPSE 7.0 STRL STRAW (GLOVE) ×4 IMPLANT
GOWN PREVENTION PLUS LG XLONG (DISPOSABLE) ×2 IMPLANT
GOWN PREVENTION PLUS XLARGE (GOWN DISPOSABLE) ×2 IMPLANT
NEEDLE SPNL 22GX3.5 QUINCKE BK (NEEDLE) IMPLANT
PACK VAGINAL MINOR WOMEN LF (CUSTOM PROCEDURE TRAY) ×2 IMPLANT
PAD PREP 24X48 CUFFED NSTRL (MISCELLANEOUS) ×2 IMPLANT
SUT MERSILENE 5MM BP 1 12 (SUTURE) ×2 IMPLANT
SYR BULB IRRIGATION 50ML (SYRINGE) ×2 IMPLANT
SYR CONTROL 10ML LL (SYRINGE) IMPLANT
TOWEL OR 17X24 6PK STRL BLUE (TOWEL DISPOSABLE) ×4 IMPLANT
TUBING NON-CON 1/4 X 20 CONN (TUBING) IMPLANT
WATER STERILE IRR 1000ML POUR (IV SOLUTION) ×2 IMPLANT
YANKAUER SUCT BULB TIP NO VENT (SUCTIONS) IMPLANT

## 2011-01-08 NOTE — Anesthesia Postprocedure Evaluation (Signed)
  Anesthesia Post-op Note  Patient: Restaurant manager, [employer]  Procedure(s) Performed:  CERCLAGE CERVICAL  Patient Location: PACU  Anesthesia Type: Spinal  Level of Consciousness: awake, alert  and oriented  Airway and Oxygen Therapy: Patient Spontanous Breathing  Post-op Pain: none  Post-op Assessment: Post-op Vital signs reviewed, Patient's Cardiovascular Status Stable, Respiratory Function Stable, Patent Airway, No signs of Nausea or vomiting, Adequate PO intake, Pain level controlled, No headache and No backache  Post-op Vital Signs: Reviewed and stable  Complications: No apparent anesthesia complications

## 2011-01-08 NOTE — Progress Notes (Addendum)
Patient instructed to return to MAU if unable to void in 6 hours from I/O cath(2130).  Verbalized understanding.  Pt's Mother was told this information by the patient.

## 2011-01-08 NOTE — Preoperative (Signed)
Beta Blockers   Reason not to administer Beta Blockers:Not Applicable 

## 2011-01-08 NOTE — Procedures (Signed)
Preoperative diagnosis: Shortened cervix with funneling.   Postoperative diagnosis: Same  Procedure: Cervical cerclage  Surgeon: Tinnie Gens, M.D.  Anesthesia: Spinal, Malen Gauze, M.D.  Findings: Cervix was proximally half centimeter dilated and soft and very anterior.  Estimated blood loss: 100 cc  Specimens: None  Reason for procedure: Patient is a 20 year old gravida 1 at 18-5/7 weeks who came for anatomy scan on the day prior to procedure. The patient was found to have normal anatomy and fluid shortness dynamic cervix with funneling. The patient was counseled about Prometrium versus cerclage and she opted for a cervical cerclage.  Procedure: Patient was taken to the operating room where spinal analgesia was administered. She was prepped and draped in the usual sterile fashion. A Foley catheter is used to drain her bladder. A timeout was performed. The patient had SCDs in place and had received a gram of Cefotan prior to procedure. The patient was in dorsal lithotomy. A weighted speculum was placed inside the vagina. A Deaver was used anteriorly. The cervix was grasped with an open ring  forcep. A 5 mm Mersilene band on a cutting needle was used and to put in a pursestring suture. This was started at 12:00 and exiting at 9:00, starting at 9:00 and exiting at 6:00, starting at 6:00 and exiting at 3:00, starting at the clinic in the 12:00. A suture was then tied down. All instrument, needle and lap counts were correct x2. The patient was awakened and taken to recovery in stable condition.  Comfort Iversen S MD 01/08/2011 11:40 AM

## 2011-01-08 NOTE — Progress Notes (Signed)
Unable to void.  Assisted back to bed.  Phone call to Dr Shawnie Pons.  I/O cath ordered.  500 cc clear yellow urine returned on I/O catheterization per protocol.

## 2011-01-08 NOTE — Transfer of Care (Signed)
Immediate Anesthesia Transfer of Care Note  Patient: Kristen Coffey  Procedure(s) Performed:  CERCLAGE CERVICAL  Patient Location: PACU  Anesthesia Type: Spinal  Level of Consciousness: awake, alert  and oriented  Airway & Oxygen Therapy: Patient Spontanous Breathing  Post-op Assessment: Post -op Vital signs reviewed and stable  Post vital signs: Reviewed and stable  Complications: No apparent anesthesia complications

## 2011-01-08 NOTE — Anesthesia Preprocedure Evaluation (Signed)
Anesthesia Evaluation  Name, MR# and DOB Patient awake  General Assessment Comment  Reviewed: Allergy & Precautions, H&P , NPO status , Patient's Chart, lab work & pertinent test results  Airway Mallampati: III TM Distance: >3 FB Neck ROM: full   Comment: Pierced tongue Dental No notable dental hx. (+) Teeth Intact   Pulmonary  clear to auscultation  pulmonary exam normalPulmonary Exam Normal breath sounds clear to auscultation none    Cardiovascular regular Normal    Neuro/Psych Negative Neurological ROS  Negative Psych ROS  GI/Hepatic/Renal negative GI ROS  negative Liver ROS  negative Renal ROS        Endo/Other  Negative Endocrine ROS (+)      Abdominal   Musculoskeletal   Hematology negative hematology ROS (+)   Peds  Reproductive/Obstetrics (+) Pregnancy    Anesthesia Other Findings             Anesthesia Physical Anesthesia Plan  ASA: II  Anesthesia Plan: Spinal   Post-op Pain Management:    Induction:   Airway Management Planned:   Additional Equipment:   Intra-op Plan:   Post-operative Plan:   Informed Consent:   Dental Advisory Given  Plan Discussed with: Anesthesiologist  Anesthesia Plan Comments:         Anesthesia Quick Evaluation

## 2011-01-08 NOTE — Progress Notes (Signed)
Assisted pt to bathroom to void and get dressed.  Able to ambulate with minimal assistance.

## 2011-01-08 NOTE — Anesthesia Procedure Notes (Signed)
Spinal Block  Patient location during procedure: OR Start time: 01/08/2011 11:01 AM Preanesthetic Checklist Completed: patient identified, site marked, surgical consent, pre-op evaluation, timeout performed, IV checked, risks and benefits discussed and monitors and equipment checked Spinal Block Patient position: sitting Prep: site prepped and draped and DuraPrep Patient monitoring: heart rate, cardiac monitor, continuous pulse ox and blood pressure Approach: midline Location: L3-4 Injection technique: single-shot Needle Needle type: Sprotte  Needle gauge: 24 G Needle length: 9 cm Needle insertion depth: 4 cm Assessment Sensory level: T4 Additional Notes Patient tolerated procedure well. Adequate sensory level.
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
## 2011-01-09 ENCOUNTER — Inpatient Hospital Stay (HOSPITAL_COMMUNITY)
Admission: AD | Admit: 2011-01-09 | Discharge: 2011-01-09 | Disposition: A | Discharge status: Home / Self Care | Payer: Medicaid Other | Source: Ambulatory Visit | Attending: Obstetrics and Gynecology | Admitting: Obstetrics and Gynecology

## 2011-01-09 ENCOUNTER — Encounter (HOSPITAL_COMMUNITY): Payer: Self-pay | Admitting: *Deleted

## 2011-01-09 ENCOUNTER — Telehealth: Payer: Self-pay | Admitting: *Deleted

## 2011-01-09 DIAGNOSIS — L239 Allergic contact dermatitis, unspecified cause: Secondary | ICD-10-CM

## 2011-01-09 DIAGNOSIS — Z3480 Encounter for supervision of other normal pregnancy, unspecified trimester: Secondary | ICD-10-CM

## 2011-01-09 DIAGNOSIS — O3430 Maternal care for cervical incompetence, unspecified trimester: Secondary | ICD-10-CM

## 2011-01-09 DIAGNOSIS — T7840XA Allergy, unspecified, initial encounter: Secondary | ICD-10-CM | POA: Insufficient documentation

## 2011-01-09 DIAGNOSIS — O99891 Other specified diseases and conditions complicating pregnancy: Secondary | ICD-10-CM | POA: Insufficient documentation

## 2011-01-09 MED ORDER — OXYCODONE-ACETAMINOPHEN 5-325 MG PO TABS
1.0000 | ORAL_TABLET | Freq: Once | ORAL | Status: AC
  Administered 2011-01-09: 1 via ORAL
  Filled 2011-01-09: qty 1

## 2011-01-09 MED ORDER — DIPHENHYDRAMINE HCL 50 MG/ML IJ SOLN
25.0000 mg | Freq: Once | INTRAMUSCULAR | Status: AC
  Administered 2011-01-09: 25 mg via INTRAMUSCULAR
  Filled 2011-01-09: qty 1

## 2011-01-09 MED ORDER — DIPHENHYDRAMINE HCL 25 MG PO TABS: 25.0000 mg | ORAL_TABLET | Freq: Four times a day (QID) | ORAL | Status: AC | PRN

## 2011-01-09 MED ORDER — COMFORT FIT MATERNITY SUPP SM MISC: 1.0000 [IU] | Status: DC | PRN

## 2011-01-09 MED ORDER — INDOMETHACIN 50 MG PO CAPS: 50.0000 mg | ORAL_CAPSULE | Freq: Four times a day (QID) | ORAL | Status: DC

## 2011-01-09 MED ORDER — OXYCODONE-ACETAMINOPHEN 5-325 MG PO TABS: 1.0000 | ORAL_TABLET | Freq: Four times a day (QID) | ORAL | Status: AC | PRN

## 2011-01-09 NOTE — Telephone Encounter (Signed)
Meds were not at the pharmacy, I recalled them in for patient

## 2011-01-09 NOTE — Progress Notes (Signed)
Pt 18.6wks G1 had cerclage placed 9/26.   Rash on back since last night, itching  And lower back pain.

## 2011-01-09 NOTE — Progress Notes (Signed)
Pt reports surgery yesterday and "i think i am having a reaction to the epid. Because i have a rash on my back and it is itching a lot. i also feel short of breath". Surgery was a cerclage.
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
## 2011-01-10 ENCOUNTER — Telehealth (HOSPITAL_COMMUNITY): Payer: Self-pay | Admitting: Obstetrics and Gynecology

## 2011-01-10 NOTE — Telephone Encounter (Signed)
Pt called stating pharmacy did not fill Rx for percoet. This was confirmed. Therefore, I did write an Rx for Percocet 5/325 to take one tab every 6hrs as needed for pain, #15 with no refills.  Clinton Gallant. Rice III, DrHSc, MPAS, PA-C

## 2011-01-10 NOTE — ED Provider Notes (Signed)
Agree with above note.  Kristen Coffey 01/10/2011 7:54 AM

## 2011-01-13 ENCOUNTER — Encounter (HOSPITAL_COMMUNITY): Payer: Self-pay | Admitting: Family Medicine

## 2011-01-13 ENCOUNTER — Ambulatory Visit (INDEPENDENT_AMBULATORY_CARE_PROVIDER_SITE_OTHER): Payer: Medicaid Other | Admitting: Obstetrics & Gynecology

## 2011-01-13 DIAGNOSIS — Z3480 Encounter for supervision of other normal pregnancy, unspecified trimester: Secondary | ICD-10-CM

## 2011-01-13 MED ORDER — COMFORT FIT MATERNITY SUPP SM MISC: 1.0000 [IU] | Status: DC | PRN

## 2011-01-13 NOTE — Progress Notes (Signed)
Addended by: Barbara Cower on: 01/13/2011 02:24 PM   Modules accepted: Orders

## 2011-01-13 NOTE — Progress Notes (Signed)
She is requesting a prescription for percocet. She says she had one given to her at the hospital for "back pain" but she wasn't able to fill it because it wasn't signed.  She denies any pregnancy complaints- no CTXs, VB, ROM. She understands pelvic rest.  She needs a note to return to work. I have shown her exercises for her back and have also given her a name of a chiropractor in [place] (where she lives).

## 2011-01-27 ENCOUNTER — Ambulatory Visit (INDEPENDENT_AMBULATORY_CARE_PROVIDER_SITE_OTHER): Payer: Medicaid Other | Admitting: Obstetrics & Gynecology

## 2011-01-27 DIAGNOSIS — R220 Localized swelling, mass and lump, head: Secondary | ICD-10-CM

## 2011-01-27 DIAGNOSIS — Z23 Encounter for immunization: Secondary | ICD-10-CM

## 2011-01-27 DIAGNOSIS — Z3400 Encounter for supervision of normal first pregnancy, unspecified trimester: Secondary | ICD-10-CM

## 2011-01-27 DIAGNOSIS — R221 Localized swelling, mass and lump, neck: Secondary | ICD-10-CM

## 2011-01-27 NOTE — Progress Notes (Signed)
She needs referral to the oral surgeon so they can do a "laser" procedure to aleve her swollen gums (I did it). 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 I will refer her to outside [facility] for this letter. She has no pregnancy problems.

## 2011-01-27 NOTE — Progress Notes (Signed)
Addended by: Barbara Cower on: 01/27/2011 06:11 PM   Modules accepted: Orders

## 2011-01-27 NOTE — Progress Notes (Signed)
Routine ob follow up/tn  Needs referral to oral surgeon due to gums are swelling and separating from her teeth.  She needs the referral due to the type of insurance she has.

## 2011-01-28 ENCOUNTER — Encounter: Payer: Self-pay | Admitting: Obstetrics and Gynecology

## 2011-01-28 LAB — ALPHA FETOPROTEIN, MATERNAL
AFP: 64.4 IU/mL
Open Spina bifida: NEGATIVE
Osb Risk: 1:40400 {titer}

## 2011-02-03 ENCOUNTER — Telehealth: Payer: Self-pay

## 2011-02-03 MED ORDER — PRENATAL PLUS 27-1 MG PO TABS: 1.0000 | ORAL_TABLET | Freq: Every day | ORAL | Status: DC

## 2011-02-03 NOTE — Telephone Encounter (Signed)
PATIENT IS OUT OF PRENATAL VITAMINS. SHE WANTS TO HAVE THEM CALLED IN TO HER PHARMACY AT [facility]. SHE TAKES PRENATAL PLUS RX# [id number]. PATIENT WOULD LIKE TO PICK THEM UP TODAY. THANKS!

## 2011-02-24 ENCOUNTER — Inpatient Hospital Stay (HOSPITAL_COMMUNITY)
Admission: AD | Admit: 2011-02-24 | Discharge: 2011-02-24 | Disposition: A | Discharge status: Home / Self Care | Payer: Medicaid Other | Source: Ambulatory Visit | Attending: Obstetrics & Gynecology | Admitting: Obstetrics & Gynecology

## 2011-02-24 ENCOUNTER — Encounter (HOSPITAL_COMMUNITY): Payer: Self-pay | Admitting: *Deleted

## 2011-02-24 DIAGNOSIS — N939 Abnormal uterine and vaginal bleeding, unspecified: Secondary | ICD-10-CM

## 2011-02-24 DIAGNOSIS — O26859 Spotting complicating pregnancy, unspecified trimester: Secondary | ICD-10-CM | POA: Insufficient documentation

## 2011-02-24 DIAGNOSIS — O3430 Maternal care for cervical incompetence, unspecified trimester: Secondary | ICD-10-CM | POA: Insufficient documentation

## 2011-02-24 DIAGNOSIS — N898 Other specified noninflammatory disorders of vagina: Secondary | ICD-10-CM

## 2011-02-24 LAB — URINALYSIS, ROUTINE W REFLEX MICROSCOPIC
Bilirubin Urine: NEGATIVE
Glucose, UA: NEGATIVE mg/dL
Ketones, ur: NEGATIVE mg/dL
Protein, ur: NEGATIVE mg/dL
Urobilinogen, UA: 0.2 mg/dL (ref 0.0–1.0)

## 2011-02-24 LAB — URINE MICROSCOPIC-ADD ON
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
## 2011-02-24 NOTE — Progress Notes (Signed)
Pt reports some "light vaginal bleeding" about an hour ago, some lower abd cramping at that time, no bleeding or cramping now. Pt reports she has a cerclage. Denies intercourse.

## 2011-02-25 ENCOUNTER — Ambulatory Visit (INDEPENDENT_AMBULATORY_CARE_PROVIDER_SITE_OTHER): Payer: Medicaid Other | Admitting: Obstetrics & Gynecology

## 2011-02-25 DIAGNOSIS — Z3480 Encounter for supervision of other normal pregnancy, unspecified trimester: Secondary | ICD-10-CM

## 2011-02-25 DIAGNOSIS — O26879 Cervical shortening, unspecified trimester: Secondary | ICD-10-CM

## 2011-02-25 NOTE — Patient Instructions (Signed)
Breastfeeding BENEFITS OF BREASTFEEDING For the baby  The first milk (colostrum) helps the baby's digestive system function better.   There are antibodies from the mother in the milk that help the baby fight off infections.   The baby has a lower incidence of asthma, allergies, and SIDS (sudden infant death syndrome).   The nutrients in breast milk are better than formulas for the baby and helps the baby's brain grow better.   Babies who breastfeed have less gas, colic, and constipation.  For the mother  Breastfeeding helps develop a very special bond between mother and baby.   It is more convenient, always available at the correct temperature and cheaper than formula feeding.   It burns calories in the mother and helps with losing weight that was gained during pregnancy.   It makes the uterus contract back down to normal size faster and slows bleeding following delivery.   Breastfeeding mothers have a lower risk of developing breast cancer.  NURSE FREQUENTLY  A healthy, full-term baby may breastfeed as often as every hour or space his or her feedings to every 3 hours.   How often to nurse will vary from baby to baby. Watch your baby for signs of hunger, not the clock.   Nurse as often as the baby requests, or when you feel the need to reduce the fullness of your breasts.   Awaken the baby if it has been 3 to 4 hours since the last feeding.   Frequent feeding will help the mother make more milk and will prevent problems like sore nipples and engorgement of the breasts.  BABY'S POSITION AT THE BREAST  Whether lying down or sitting, be sure that the baby's tummy is facing your tummy.   Support the breast with 4 fingers underneath the breast and the thumb above. Make sure your fingers are well away from the nipple and baby's mouth.   Stroke the baby's lips and cheek closest to the breast gently with your finger or nipple.   When the baby's mouth is open wide enough, place all  of your nipple and as much of the dark area around the nipple as possible into your baby's mouth.   Pull the baby in close so the tip of the nose and the baby's cheeks touch the breast during the feeding.  FEEDINGS  The length of each feeding varies from baby to baby and from feeding to feeding.   The baby must suck about 2 to 3 minutes for your milk to get to him or her. This is called a "let down." For this reason, allow the baby to feed on each breast as long as he or she wants. Your baby will end the feeding when he or she has received the right balance of nutrients.   To break the suction, put your finger into the corner of the baby's mouth and slide it between his or her gums before removing your breast from his or her mouth. This will help prevent sore nipples.  REDUCING BREAST ENGORGEMENT  In the first week after your baby is born, you may experience signs of breast engorgement. When breasts are engorged, they feel heavy, warm, full, and may be tender to the touch. You can reduce engorgement if you:   Nurse frequently, every 2 to 3 hours. Mothers who breastfeed early and often have fewer problems with engorgement.   Place light ice packs on your breasts between feedings. This reduces swelling. Wrap the ice packs in a   lightweight towel to protect your skin.   Apply moist hot packs to your breast for 5 to 10 minutes before each feeding. This increases circulation and helps the milk flow.   Gently massage your breast before and during the feeding.   Make sure that the baby empties at least one breast at every feeding before switching sides.   Use a breast pump to empty the breasts if your baby is sleepy or not nursing well. You may also want to pump if you are returning to work or or you feel you are getting engorged.   Avoid bottle feeds, pacifiers or supplemental feedings of water or juice in place of breastfeeding.   Be sure the baby is latched on and positioned properly while  breastfeeding.   Prevent fatigue, stress, and anemia.   Wear a supportive bra, avoiding underwire styles.   Eat a balanced diet with enough fluids.  If you follow these suggestions, your engorgement should improve in 24 to 48 hours. If you are still experiencing difficulty, call your lactation consultant or caregiver. IS MY BABY GETTING ENOUGH MILK? Sometimes, mothers worry about whether their babies are getting enough milk. You can be assured that your baby is getting enough milk if:  The baby is actively sucking and you hear swallowing.   The baby nurses at least 8 to 12 times in a 24 hour time period. Nurse your baby until he or she unlatches or falls asleep at the first breast (at least 10 to 20 minutes), then offer the second side.   The baby is wetting 5 to 6 disposable diapers (6 to 8 cloth diapers) in a 24 hour period by 5 to 6 days of age.   The baby is having at least 2 to 3 stools every 24 hours for the first few months. Breast milk is all the food your baby needs. It is not necessary for your baby to have water or formula. In fact, to help your breasts make more milk, it is best not to give your baby supplemental feedings during the early weeks.   The stool should be soft and yellow.   The baby should gain 4 to 7 ounces per week after he is 4 days old.  TAKE CARE OF YOURSELF Take care of your breasts by:  Bathing or showering daily.   Avoiding the use of soaps on your nipples.   Start feedings on your left breast at one feeding and on your right breast at the next feeding.   You will notice an increase in your milk supply 2 to 5 days after delivery. You may feel some discomfort from engorgement, which makes your breasts very firm and often tender. Engorgement "peaks" out within 24 to 48 hours. In the meantime, apply warm moist towels to your breasts for 5 to 10 minutes before feeding. Gentle massage and expression of some milk before feeding will soften your breasts, making  it easier for your baby to latch on. Wear a well fitting nursing bra and air dry your nipples for 10 to 15 minutes after each feeding.   Only use cotton bra pads.   Only use pure lanolin on your nipples after nursing. You do not need to wash it off before nursing.  Take care of yourself by:   Eating well-balanced meals and nutritious snacks.   Drinking milk, fruit juice, and water to satisfy your thirst (about 8 glasses a day).   Getting plenty of rest.   Increasing calcium in   your diet (1200 mg a day).   Avoiding foods that you notice affect the baby in a bad way.  SEEK MEDICAL CARE IF:   You have any questions or difficulty with breastfeeding.   You need help.   You have a hard, red, sore area on your breast, accompanied by a fever of 100.5 F (38.1 C) or more.   Your baby is too sleepy to eat well or is having trouble sleeping.   Your baby is wetting less than 6 diapers per day, by 63 days of age.   Your baby's skin or white part of his or her eyes is more yellow than it was in the hospital.   You feel depressed.  Document Released: 03/31/2005 Document Revised: 12/11/2010 Document Reviewed: 11/13/2008 [facility] Patient Information 2012 [place], [place].  Pregnancy - Third Trimester The third trimester of pregnancy (the last 3 months) is a period of the most rapid growth for you and your baby. The baby approaches a length of 20 inches and a weight of 6 to 10 pounds. The baby is adding on fat and getting ready for life outside your body. While inside, babies have periods of sleeping and waking, suck their thumbs, and hiccups. You can often feel small contractions of the uterus. This is false labor. It is also called Braxton-Hicks contractions. This is like a practice for labor. The usual problems in this stage of pregnancy include more difficulty breathing, swelling of the hands and feet from water retention, and having to urinate more often because of the uterus and baby pressing  on your bladder.  PRENATAL EXAMS  Blood work may continue to be done during prenatal exams. These tests are done to check on your health and the probable health of your baby. Blood work is used to follow your blood levels (hemoglobin). Anemia (low hemoglobin) is common during pregnancy. Iron and vitamins are given to help prevent this. You may also continue to be checked for diabetes. Some of the past blood tests may be done again.   The size of the uterus is measured during each visit. This makes sure your baby is growing properly according to your pregnancy dates.   Your blood pressure is checked every prenatal visit. This is to make sure you are not getting toxemia.   Your urine is checked every prenatal visit for infection, diabetes and protein.   Your weight is checked at each visit. This is done to make sure gains are happening at the suggested rate and that you and your baby are growing normally.   Sometimes, an ultrasound is performed to confirm the position and the proper growth and development of the baby. This is a test done that bounces harmless sound waves off the baby so your caregiver can more accurately determine due dates.   Discuss the type of pain medication and anesthesia you will have during your labor and delivery.   Discuss the possibility and anesthesia if a Cesarean Section might be necessary.   Inform your caregiver if there is any mental or physical violence at home.  Sometimes, a specialized non-stress test, contraction stress test and biophysical profile are done to make sure the baby is not having a problem. Checking the amniotic fluid surrounding the baby is called an amniocentesis. The amniotic fluid is removed by sticking a needle into the belly (abdomen). This is sometimes done near the end of pregnancy if an early delivery is required. In this case, it is done to help make sure  the baby's lungs are mature enough for the baby to live outside of the womb. If the  lungs are not mature and it is unsafe to deliver the baby, an injection of cortisone medication is given to the mother 1 to 2 days before the delivery. This helps the baby's lungs mature and makes it safer to deliver the baby. CHANGES OCCURING IN THE THIRD TRIMESTER OF PREGNANCY Your body goes through many changes during pregnancy. They vary from person to person. Talk to your caregiver about changes you notice and are concerned about.  During the last trimester, you have probably had an increase in your appetite. It is normal to have cravings for certain foods. This varies from person to person and pregnancy to pregnancy.   You may begin to get stretch marks on your hips, abdomen, and breasts. These are normal changes in the body during pregnancy. There are no exercises or medications to take which prevent this change.   Constipation may be treated with a stool softener or adding bulk to your diet. Drinking lots of fluids, fiber in vegetables, fruits, and whole grains are helpful.   Exercising is also helpful. If you have been very active up until your pregnancy, most of these activities can be continued during your pregnancy. If you have been less active, it is helpful to start an exercise program such as walking. Consult your caregiver before starting exercise programs.   Avoid all smoking, alcohol, un-prescribed drugs, herbs and "street drugs" during your pregnancy. These chemicals affect the formation and growth of the baby. Avoid chemicals throughout the pregnancy to ensure the delivery of a healthy infant.   Backache, varicose veins and hemorrhoids may develop or get worse.   You will tire more easily in the third trimester, which is normal.   The baby's movements may be stronger and more often.   You may become short of breath easily.   Your belly button may stick out.   A yellow discharge may leak from your breasts called colostrum.   You may have a bloody mucus discharge. This  usually occurs a few days to a week before labor begins.  HOME CARE INSTRUCTIONS   Keep your caregiver's appointments. Follow your caregiver's instructions regarding medication use, exercise, and diet.   During pregnancy, you are providing food for you and your baby. Continue to eat regular, well-balanced meals. Choose foods such as meat, fish, milk and other low fat dairy products, vegetables, fruits, and whole-grain breads and cereals. Your caregiver will tell you of the ideal weight gain.   A physical sexual relationship may be continued throughout pregnancy if there are no other problems such as early (premature) leaking of amniotic fluid from the membranes, vaginal bleeding, or belly (abdominal) pain.   Exercise regularly if there are no restrictions. Check with your caregiver if you are unsure of the safety of your exercises. Greater weight gain will occur in the last 2 trimesters of pregnancy. Exercising helps:   Control your weight.   Get you in shape for labor and delivery.   You lose weight after you deliver.   Rest a lot with legs elevated, or as needed for leg cramps or low back pain.   Wear a good support or jogging bra for breast tenderness during pregnancy. This may help if worn during sleep. Pads or tissues may be used in the bra if you are leaking colostrum.   Do not use hot tubs, steam rooms, or saunas.   Wear your  seat belt when driving. This protects you and your baby if you are in an accident.   Avoid raw meat, cat litter boxes and soil used by cats. These carry germs that can cause birth defects in the baby.   It is easier to loose urine during pregnancy. Tightening up and strengthening the pelvic muscles will help with this problem. You can practice stopping your urination while you are going to the bathroom. These are the same muscles you need to strengthen. It is also the muscles you would use if you were trying to stop from passing gas. You can practice tightening  these muscles up 10 times a set and repeating this about 3 times per day. Once you know what muscles to tighten up, do not perform these exercises during urination. It is more likely to cause an infection by backing up the urine.   Ask for help if you have financial, counseling or nutritional needs during pregnancy. Your caregiver will be able to offer counseling for these needs as well as refer you for other special needs.   Make a list of emergency phone numbers and have them available.   Plan on getting help from family or friends when you go home from the hospital.   Make a trial run to the hospital.   Take prenatal classes with the father to understand, practice and ask questions about the labor and delivery.   Prepare the baby's room/nursery.   Do not travel out of the city unless it is absolutely necessary and with the advice of your caregiver.   Wear only low or no heal shoes to have better balance and prevent falling.  MEDICATIONS AND DRUG USE IN PREGNANCY  Take prenatal vitamins as directed. The vitamin should contain 1 milligram of folic acid. Keep all vitamins out of reach of children. Only a couple vitamins or tablets containing iron may be fatal to a baby or young child when ingested.   Avoid use of all medications, including herbs, over-the-counter medications, not prescribed or suggested by your caregiver. Only take over-the-counter or prescription medicines for pain, discomfort, or fever as directed by your caregiver. Do not use aspirin, ibuprofen (Motrin, Advil, Nuprin) or naproxen (Aleve) unless OK'd by your caregiver.   Let your caregiver also know about herbs you may be using.   Alcohol is related to a number of birth defects. This includes fetal alcohol syndrome. All alcohol, in any form, should be avoided completely. Smoking will cause low birth rate and premature babies.   Street/illegal drugs are very harmful to the baby. They are absolutely forbidden. A baby  born to an addicted mother will be addicted at birth. The baby will go through the same withdrawal an adult does.  SEEK MEDICAL CARE IF: You have any concerns or worries during your pregnancy. It is better to call with your questions if you feel they cannot wait, rather than worry about them. DECISIONS ABOUT CIRCUMCISION You may or may not know the sex of your baby. If you know your baby is a boy, it may be time to think about circumcision. Circumcision is the removal of the foreskin of the penis. This is the skin that covers the sensitive end of the penis. There is no proven medical need for this. Often this decision is made on what is popular at the time or based upon religious beliefs and social issues. You can discuss these issues with your caregiver or pediatrician. SEEK IMMEDIATE MEDICAL CARE IF:   An  unexplained oral temperature above 102 F (38.9 C) develops, or as your caregiver suggests.   You have leaking of fluid from the vagina (birth canal). If leaking membranes are suspected, take your temperature and tell your caregiver of this when you call.   There is vaginal spotting, bleeding or passing clots. Tell your caregiver of the amount and how many pads are used.   You develop a bad smelling vaginal discharge with a change in the color from clear to white.   You develop vomiting that lasts more than 24 hours.   You develop chills or fever.   You develop shortness of breath.   You develop burning on urination.   You loose more than 2 pounds of weight or gain more than 2 pounds of weight or as suggested by your caregiver.   You notice sudden swelling of your face, hands, and feet or legs.   You develop belly (abdominal) pain. Round ligament discomfort is a common non-cancerous (benign) cause of abdominal pain in pregnancy. Your caregiver still must evaluate you.   You develop a severe headache that does not go away.   You develop visual problems, blurred or double vision.    If you have not felt your baby move for more than 1 hour. If you think the baby is not moving as much as usual, eat something with sugar in it and lie down on your left side for an hour. The baby should move at least 4 to 5 times per hour. Call right away if your baby moves less than that.   You fall, are in a car accident or any kind of trauma.   There is mental or physical violence at home.  Document Released: 03/25/2001 Document Revised: 12/11/2010 Document Reviewed: 09/27/2008 [facility] Patient Information 2012 [place], [place].   Contraception Choices Birth control (contraception) can stop pregnancy from happening. Different types of birth control work in different ways. Some can:  Make the mucus in the cervix thick. This makes it hard for sperm to get into the uterus.   Thin the lining of the uterus. This makes it hard for an egg to attach to the wall of the uterus.   Stop the ovaries from releasing an egg.   Block the sperm from reaching the egg.  Certain types of surgery can stop pregnancy from happening. For women, the sugery closes the fallopian tubes (tubal ligation). For men, the surgery stops sperm from releasing during sex (vasectomy). HORMONAL BIRTH CONTROL Hormonal birth control stops pregnancy by putting hormones into your body. Types of birth control include:  A small tube put under the skin of the upper arm (implant). The tube can stay in place for 3 years.   Shots given every 3 months.   Pills taken every day or once after sex (intercourse).   Patches that are changed once a week.   A ring put into the vagina (vaginal ring). The ring is left in place for 3 weeks and removed for 1 week. Then, a new ring is put in the vagina.  BARRIER BIRTH CONTROL  Barrier birth control blocks sperm from reaching the egg. Types of birth control include:   A thin covering worn on the penis (female condom) during sex.   A soft, loose covering put into the vagina (female condom)  before sex.   A rubber bowl that sits over the cervix (diaphragm). The bowl must be made for you. The bowl is put into the vagina before sex. The bowl  is left in place for 6 to 8 hours after sex.   A small, soft cup that fits over the cervix (cervical cap). The cup must be made for you. The cup can be left in place for 48 hours after sex.   A sponge that is put into the vagina before sex.   A chemical that kills or blocks sperm from getting into the cervix and uterus (spermicide). The chemical may be a cream, jelly, foam, or pill.  INTRAUTERINE (IUD) BIRTH CONTROL  IUD birth control is a small, T-shaped piece of plastic. The plastic is put inside the uterus. There are 2 types of IUD:  Copper IUD. The IUD is covered in copper wire. The copper makes a fluid that kills sperm. It can stay in place for 10 years.   Hormone IUD. The hormone stops pregnancy from happening. It can stay in place for 5 years.  NATURAL FAMILY PLANNING BIRTH CONTROL  Natural family planning means not having sex or using barrier birth control when the woman is fertile. A woman can:  Use a calendar to keep track of when she is fertile.   Use a thermometer to measure her body temperature.  Protect yourself against sexual diseases no matter what type of birth control you use. Talk to your doctor about which type of birth control is best for you. Document Released: 01/26/2009 Document Revised: 12/11/2010 Document Reviewed: 08/07/2010 [facility] Patient Information 2012 [place], [place].

## 2011-02-25 NOTE — Progress Notes (Signed)
Seen in the [facility] last night for bleeding and cramping, negative evaluation, cerclage stitch noted to be in place. Continue progesterone vaginal suppositories. No other concerns, denies contractions, vaginal bleeding or leaking of fluid.  Reports good fetal movement.  Fetal movement and preterm labor precautions reviewed. Return to clinic in 2 weeks for third trimester labs, glucola.  Plans to breast feed; Depo Provera for birth control but counseled regarding other methods.  Patient handouts given about third trimester, breastfeeding and contraception.

## 2011-02-27 NOTE — ED Provider Notes (Signed)
Agree with above note.  Kristen Coffey. 02/27/2011 12:37 PM

## 2011-03-01 ENCOUNTER — Inpatient Hospital Stay (HOSPITAL_COMMUNITY): Payer: Medicaid Other

## 2011-03-01 ENCOUNTER — Inpatient Hospital Stay (HOSPITAL_COMMUNITY)
Admission: AD | Admit: 2011-03-01 | Discharge: 2011-03-01 | Disposition: A | Discharge status: Home / Self Care | Payer: Medicaid Other | Source: Ambulatory Visit | Attending: Obstetrics & Gynecology | Admitting: Obstetrics & Gynecology

## 2011-03-01 DIAGNOSIS — O4690 Antepartum hemorrhage, unspecified, unspecified trimester: Secondary | ICD-10-CM | POA: Insufficient documentation

## 2011-03-01 DIAGNOSIS — Z3480 Encounter for supervision of other normal pregnancy, unspecified trimester: Secondary | ICD-10-CM

## 2011-03-01 LAB — URINE MICROSCOPIC-ADD ON

## 2011-03-01 LAB — URINALYSIS, ROUTINE W REFLEX MICROSCOPIC
Bilirubin Urine: NEGATIVE
Nitrite: NEGATIVE
Specific Gravity, Urine: 1.015 (ref 1.005–1.030)
Urobilinogen, UA: 0.2 mg/dL (ref 0.0–1.0)
pH: 6 (ref 5.0–8.0)

## 2011-03-01 LAB — WET PREP, GENITAL: Yeast Wet Prep HPF POC: NONE SEEN

## 2011-03-01 LAB — CBC
Hemoglobin: 12.2 g/dL (ref 12.0–15.0)
MCH: 34.6 pg — ABNORMAL HIGH (ref 26.0–34.0)
Platelets: 209 10*3/uL (ref 150–400)
RBC: 3.53 MIL/uL — ABNORMAL LOW (ref 3.87–5.11)

## 2011-03-01 NOTE — ED Provider Notes (Signed)
Attestation of Attending Supervision of Advanced Practitioner: Evaluation and management procedures were performed by the PA/NP/CNM/OB Fellow under my supervision/collaboration. Chart reviewed, and agree with management and plan.  Mykenna Viele, M.D. 03/01/2011 2:19 PM   

## 2011-03-01 NOTE — ED Notes (Signed)
Diet ordered for patient chicken tenders, fries, peach cobbler, ranch dressing and BBQ sauce , apple juice.

## 2011-03-01 NOTE — ED Notes (Signed)
Patient received regular diet.

## 2011-03-01 NOTE — ED Provider Notes (Signed)
Wet prep shows many WBCs. Will get GC and Chl. Will D/C home.

## 2011-03-01 NOTE — ED Provider Notes (Signed)
Attestation of Attending Supervision of Advanced Practitioner: Evaluation and management procedures were performed by the PA/NP/CNM/OB Fellow under my supervision/collaboration. Chart reviewed, and agree with management and plan.  Jaynie Collins, M.D. 03/01/2011 2:19 PM
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
## 2011-03-01 NOTE — Progress Notes (Signed)
Pt reports she had heavy vaginal bleeding this morning. Pt has cerclaige. Had bleeding   On 11/12 adn evaluated in MAU. Pt reports she has not felt baby move that much this morning.

## 2011-03-01 NOTE — ED Notes (Addendum)
[place] discharge noted on pad scant amount

## 2011-03-09 ENCOUNTER — Encounter (HOSPITAL_COMMUNITY): Payer: Self-pay | Admitting: *Deleted

## 2011-03-09 ENCOUNTER — Inpatient Hospital Stay (HOSPITAL_COMMUNITY)
Admission: AD | Admit: 2011-03-09 | Discharge: 2011-03-09 | Disposition: A | Discharge status: Home / Self Care | Payer: Medicaid Other | Source: Ambulatory Visit | Attending: Obstetrics & Gynecology | Admitting: Obstetrics & Gynecology

## 2011-03-09 DIAGNOSIS — O212 Late vomiting of pregnancy: Secondary | ICD-10-CM | POA: Insufficient documentation

## 2011-03-09 DIAGNOSIS — O99891 Other specified diseases and conditions complicating pregnancy: Secondary | ICD-10-CM | POA: Insufficient documentation

## 2011-03-09 DIAGNOSIS — K6289 Other specified diseases of anus and rectum: Secondary | ICD-10-CM | POA: Insufficient documentation

## 2011-03-09 DIAGNOSIS — Z3480 Encounter for supervision of other normal pregnancy, unspecified trimester: Secondary | ICD-10-CM

## 2011-03-09 DIAGNOSIS — R110 Nausea: Secondary | ICD-10-CM

## 2011-03-09 LAB — URINALYSIS, ROUTINE W REFLEX MICROSCOPIC
Bilirubin Urine: NEGATIVE
Glucose, UA: NEGATIVE mg/dL
Ketones, ur: NEGATIVE mg/dL
Nitrite: NEGATIVE
Specific Gravity, Urine: 1.025 (ref 1.005–1.030)
pH: 6 (ref 5.0–8.0)

## 2011-03-09 LAB — URINE MICROSCOPIC-ADD ON

## 2011-03-09 MED ORDER — ONDANSETRON 8 MG PO TBDP: 8.0000 mg | ORAL_TABLET | Freq: Once | ORAL | Status: AC

## 2011-03-09 MED ORDER — ONDANSETRON 8 MG PO TBDP
8.0000 mg | ORAL_TABLET | Freq: Once | ORAL | Status: AC
  Administered 2011-03-09: 8 mg via ORAL
  Filled 2011-03-09: qty 1
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
## 2011-03-09 NOTE — Progress Notes (Signed)
Pt has cerclage due to shortening cervix seen on u/s.  Pt c/o vomiting whenever eating over past 24 hr, 3 times.  C/o anal discomfort that she has not felt before with anal sex, had anal sex 2 days ago.

## 2011-03-09 NOTE — Progress Notes (Addendum)
Pt reports not being keep anything down since yesterday.  Pt reports havig frequent loose BM and Reports some anal discomfort as well.

## 2011-03-11 ENCOUNTER — Ambulatory Visit (INDEPENDENT_AMBULATORY_CARE_PROVIDER_SITE_OTHER): Payer: Medicaid Other | Admitting: Obstetrics and Gynecology

## 2011-03-11 DIAGNOSIS — O26879 Cervical shortening, unspecified trimester: Secondary | ICD-10-CM

## 2011-03-11 DIAGNOSIS — Z3480 Encounter for supervision of other normal pregnancy, unspecified trimester: Secondary | ICD-10-CM

## 2011-03-11 NOTE — Progress Notes (Signed)
Seen in MAU on 11/25 for anal discomfort s/p anal intercourse. Patient with persistent discomfort. Superficial abrasion visualized at 6 o'clock. Perineal care instructions provided. Patient advised to avoid intercourse in view of a rescue cerclage being placed. FM/PTL precautions reviewed. F/U 1hr GCT

## 2011-03-24 ENCOUNTER — Ambulatory Visit (INDEPENDENT_AMBULATORY_CARE_PROVIDER_SITE_OTHER): Payer: Medicaid Other | Admitting: *Deleted

## 2011-03-24 VITALS — BP 89/69 | Wt 152.0 lb

## 2011-03-24 DIAGNOSIS — N883 Incompetence of cervix uteri: Secondary | ICD-10-CM

## 2011-03-24 DIAGNOSIS — O3430 Maternal care for cervical incompetence, unspecified trimester: Secondary | ICD-10-CM

## 2011-03-24 NOTE — Progress Notes (Signed)
Addended by: Barbara Cower on: 03/24/2011 05:10 PM   Modules accepted: Orders

## 2011-03-24 NOTE — Progress Notes (Signed)
She is here for a regular OB visit. She continues to complain of some anal issues-internal itching, occasional rectal mucous.  I did GC/Chlamydia cultures today from her anus. I feel internal hemorroids on exam. She reports good FM and denies VB, ROM, or CTX. Her cervix is closed and thick and the cerclage is intact. She may be traveling to [facility] [facility] for Christmas. I have recommended frequent stops and walking to help to prevent DVTs and I have told her to take a copy of her records.

## 2011-03-25 LAB — GC/CHLAMYDIA PROBE AMP, GENITAL: GC Probe Amp, Genital: POSITIVE — AB

## 2011-03-26 MED ORDER — CEFTRIAXONE SODIUM 1 G IJ SOLR: 500.0000 mg | Freq: Once | INTRAMUSCULAR | Status: DC

## 2011-03-26 NOTE — Progress Notes (Signed)
Addended by: Barbara Cower on: 03/26/2011 04:59 PM   Modules accepted: Orders

## 2011-03-27 ENCOUNTER — Ambulatory Visit: Payer: Medicaid Other | Admitting: Gynecology

## 2011-04-09 ENCOUNTER — Ambulatory Visit (INDEPENDENT_AMBULATORY_CARE_PROVIDER_SITE_OTHER): Payer: Medicaid Other | Admitting: Obstetrics & Gynecology

## 2011-04-09 DIAGNOSIS — O219 Vomiting of pregnancy, unspecified: Secondary | ICD-10-CM

## 2011-04-09 DIAGNOSIS — O26879 Cervical shortening, unspecified trimester: Secondary | ICD-10-CM

## 2011-04-09 DIAGNOSIS — Z3480 Encounter for supervision of other normal pregnancy, unspecified trimester: Secondary | ICD-10-CM

## 2011-04-09 MED ORDER — ESOMEPRAZOLE MAGNESIUM 20 MG PO PACK: 20.0000 mg | PACK | Freq: Every day | ORAL | Status: DC

## 2011-04-09 MED ORDER — ONDANSETRON 4 MG PO TBDP: 4.0000 mg | ORAL_TABLET | Freq: Four times a day (QID) | ORAL | Status: AC | PRN

## 2011-04-09 NOTE — Progress Notes (Signed)
Patient is doing well except for increase in nausea and vomiting.  She has tried otc meds to see if they might help such as gaviscon but she has had no relief.

## 2011-04-09 NOTE — Patient Instructions (Signed)
Breastfeeding BENEFITS OF BREASTFEEDING For the baby  The first milk (colostrum) helps the baby's digestive system function better.   There are antibodies from the mother in the milk that help the baby fight off infections.   The baby has a lower incidence of asthma, allergies, and SIDS (sudden infant death syndrome).   The nutrients in breast milk are better than formulas for the baby and helps the baby's brain grow better.   Babies who breastfeed have less gas, colic, and constipation.  For the mother  Breastfeeding helps develop a very special bond between mother and baby.   It is more convenient, always available at the correct temperature and cheaper than formula feeding.   It burns calories in the mother and helps with losing weight that was gained during pregnancy.   It makes the uterus contract back down to normal size faster and slows bleeding following delivery.   Breastfeeding mothers have a lower risk of developing breast cancer.  NURSE FREQUENTLY  A healthy, full-term baby may breastfeed as often as every hour or space his or her feedings to every 3 hours.   How often to nurse will vary from baby to baby. Watch your baby for signs of hunger, not the clock.   Nurse as often as the baby requests, or when you feel the need to reduce the fullness of your breasts.   Awaken the baby if it has been 3 to 4 hours since the last feeding.   Frequent feeding will help the mother make more milk and will prevent problems like sore nipples and engorgement of the breasts.  BABY'S POSITION AT THE BREAST  Whether lying down or sitting, be sure that the baby's tummy is facing your tummy.   Support the breast with 4 fingers underneath the breast and the thumb above. Make sure your fingers are well away from the nipple and baby's mouth.   Stroke the baby's lips and cheek closest to the breast gently with your finger or nipple.   When the baby's mouth is open wide enough, place  all of your nipple and as much of the dark area around the nipple as possible into your baby's mouth.   Pull the baby in close so the tip of the nose and the baby's cheeks touch the breast during the feeding.  FEEDINGS  The length of each feeding varies from baby to baby and from feeding to feeding.   The baby must suck about 2 to 3 minutes for your milk to get to him or her. This is called a "let down." For this reason, allow the baby to feed on each breast as long as he or she wants. Your baby will end the feeding when he or she has received the right balance of nutrients.   To break the suction, put your finger into the corner of the baby's mouth and slide it between his or her gums before removing your breast from his or her mouth. This will help prevent sore nipples.  REDUCING BREAST ENGORGEMENT  In the first week after your baby is born, you may experience signs of breast engorgement. When breasts are engorged, they feel heavy, warm, full, and may be tender to the touch. You can reduce engorgement if you:   Nurse frequently, every 2 to 3 hours. Mothers who breastfeed early and often have fewer problems with engorgement.   Place light ice packs on your breasts between feedings. This reduces swelling. Wrap the ice packs in a   lightweight towel to protect your skin.   Apply moist hot packs to your breast for 5 to 10 minutes before each feeding. This increases circulation and helps the milk flow.   Gently massage your breast before and during the feeding.   Make sure that the baby empties at least one breast at every feeding before switching sides.   Use a breast pump to empty the breasts if your baby is sleepy or not nursing well. You may also want to pump if you are returning to work or or you feel you are getting engorged.   Avoid bottle feeds, pacifiers or supplemental feedings of water or juice in place of breastfeeding.   Be sure the baby is latched on and positioned properly while  breastfeeding.   Prevent fatigue, stress, and anemia.   Wear a supportive bra, avoiding underwire styles.   Eat a balanced diet with enough fluids.  If you follow these suggestions, your engorgement should improve in 24 to 48 hours. If you are still experiencing difficulty, call your lactation consultant or caregiver. IS MY BABY GETTING ENOUGH MILK? Sometimes, mothers worry about whether their babies are getting enough milk. You can be assured that your baby is getting enough milk if:  The baby is actively sucking and you hear swallowing.   The baby nurses at least 8 to 12 times in a 24 hour time period. Nurse your baby until he or she unlatches or falls asleep at the first breast (at least 10 to 20 minutes), then offer the second side.   The baby is wetting 5 to 6 disposable diapers (6 to 8 cloth diapers) in a 24 hour period by 5 to 6 days of age.   The baby is having at least 2 to 3 stools every 24 hours for the first few months. Breast milk is all the food your baby needs. It is not necessary for your baby to have water or formula. In fact, to help your breasts make more milk, it is best not to give your baby supplemental feedings during the early weeks.   The stool should be soft and yellow.   The baby should gain 4 to 7 ounces per week after he is 4 days old.  TAKE CARE OF YOURSELF Take care of your breasts by:  Bathing or showering daily.   Avoiding the use of soaps on your nipples.   Start feedings on your left breast at one feeding and on your right breast at the next feeding.   You will notice an increase in your milk supply 2 to 5 days after delivery. You may feel some discomfort from engorgement, which makes your breasts very firm and often tender. Engorgement "peaks" out within 24 to 48 hours. In the meantime, apply warm moist towels to your breasts for 5 to 10 minutes before feeding. Gentle massage and expression of some milk before feeding will soften your breasts, making  it easier for your baby to latch on. Wear a well fitting nursing bra and air dry your nipples for 10 to 15 minutes after each feeding.   Only use cotton bra pads.   Only use pure lanolin on your nipples after nursing. You do not need to wash it off before nursing.  Take care of yourself by:   Eating well-balanced meals and nutritious snacks.   Drinking milk, fruit juice, and water to satisfy your thirst (about 8 glasses a day).   Getting plenty of rest.   Increasing calcium in   your diet (1200 mg a day).   Avoiding foods that you notice affect the baby in a bad way.  SEEK MEDICAL CARE IF:   You have any questions or difficulty with breastfeeding.   You need help.   You have a hard, red, sore area on your breast, accompanied by a fever of 100.5 F (38.1 C) or more.   Your baby is too sleepy to eat well or is having trouble sleeping.   Your baby is wetting less than 6 diapers per day, by 5 days of age.   Your baby's skin or white part of his or her eyes is more yellow than it was in the hospital.   You feel depressed.  Document Released: 03/31/2005 Document Revised: 12/11/2010 Document Reviewed: 11/13/2008 [employer] Patient Information 2012 [employer]. 

## 2011-04-09 NOTE — Progress Notes (Signed)
Will prescribe Nexium and Zofran as needed. No other complaints or concerns.  Fetal movement and labor precautions reviewed.

## 2011-04-15 NOTE — L&D Delivery Note (Signed)
Delivery Note At 4:39 PM a viable female was delivered via Vaginal, Spontaneous Delivery (Presentation: Left Occiput Anterior). Loose nuchal cord, delivered through. APGAR: 8, 8; weight 5 lb 3.4 oz (2364 g).   Placenta status: Intact, Spontaneous by Veatrice Kells.  Cord: 3 vessels with the following complications: None.  Cord pH: 7.32  Anesthesia: Epidural  Episiotomy: None Lacerations: None Suture Repair: n/a Est. Blood Loss (mL): 400  Mom to postpartum.  Baby to nursery-stable.  Tashi Andujo E. 04/26/2011, 5:45 PM

## 2011-04-22 ENCOUNTER — Ambulatory Visit (INDEPENDENT_AMBULATORY_CARE_PROVIDER_SITE_OTHER): Payer: Medicaid Other | Admitting: Family Medicine

## 2011-04-22 DIAGNOSIS — Z3480 Encounter for supervision of other normal pregnancy, unspecified trimester: Secondary | ICD-10-CM

## 2011-04-22 NOTE — Patient Instructions (Signed)
Pregnancy - Third Trimester The third trimester of pregnancy (the last 3 months) is a period of the most rapid growth for you and your baby. The baby approaches a length of 20 inches and a weight of 6 to 10 pounds. The baby is adding on fat and getting ready for life outside your body. While inside, babies have periods of sleeping and waking, suck their thumbs, and hiccups. You can often feel small contractions of the uterus. This is false labor. It is also called Braxton-Hicks contractions. This is like a practice for labor. The usual problems in this stage of pregnancy include more difficulty breathing, swelling of the hands and feet from water retention, and having to urinate more often because of the uterus and baby pressing on your bladder.  PRENATAL EXAMS  Blood work may continue to be done during prenatal exams. These tests are done to check on your health and the probable health of your baby. Blood work is used to follow your blood levels (hemoglobin). Anemia (low hemoglobin) is common during pregnancy. Iron and vitamins are given to help prevent this. You may also continue to be checked for diabetes. Some of the past blood tests may be done again.   The size of the uterus is measured during each visit. This makes sure your baby is growing properly according to your pregnancy dates.   Your blood pressure is checked every prenatal visit. This is to make sure you are not getting toxemia.   Your urine is checked every prenatal visit for infection, diabetes and protein.   Your weight is checked at each visit. This is done to make sure gains are happening at the suggested rate and that you and your baby are growing normally.   Sometimes, an ultrasound is performed to confirm the position and the proper growth and development of the baby. This is a test done that bounces harmless sound waves off the baby so your caregiver can more accurately determine due dates.   Discuss the type of pain  medication and anesthesia you will have during your labor and delivery.   Discuss the possibility and anesthesia if a Cesarean Section might be necessary.   Inform your caregiver if there is any mental or physical violence at home.  Sometimes, a specialized non-stress test, contraction stress test and biophysical profile are done to make sure the baby is not having a problem. Checking the amniotic fluid surrounding the baby is called an amniocentesis. The amniotic fluid is removed by sticking a needle into the belly (abdomen). This is sometimes done near the end of pregnancy if an early delivery is required. In this case, it is done to help make sure the baby's lungs are mature enough for the baby to live outside of the womb. If the lungs are not mature and it is unsafe to deliver the baby, an injection of cortisone medication is given to the mother 1 to 2 days before the delivery. This helps the baby's lungs mature and makes it safer to deliver the baby. CHANGES OCCURING IN THE THIRD TRIMESTER OF PREGNANCY Your body goes through many changes during pregnancy. They vary from person to person. Talk to your caregiver about changes you notice and are concerned about.  During the last trimester, you have probably had an increase in your appetite. It is normal to have cravings for certain foods. This varies from person to person and pregnancy to pregnancy.   You may begin to get stretch marks on your hips,   abdomen, and breasts. These are normal changes in the body during pregnancy. There are no exercises or medications to take which prevent this change.   Constipation may be treated with a stool softener or adding bulk to your diet. Drinking lots of fluids, fiber in vegetables, fruits, and whole grains are helpful.   Exercising is also helpful. If you have been very active up until your pregnancy, most of these activities can be continued during your pregnancy. If you have been less active, it is helpful  to start an exercise program such as walking. Consult your caregiver before starting exercise programs.   Avoid all smoking, alcohol, un-prescribed drugs, herbs and "street drugs" during your pregnancy. These chemicals affect the formation and growth of the baby. Avoid chemicals throughout the pregnancy to ensure the delivery of a healthy infant.   Backache, varicose veins and hemorrhoids may develop or get worse.   You will tire more easily in the third trimester, which is normal.   The baby's movements may be stronger and more often.   You may become short of breath easily.   Your belly button may stick out.   A yellow discharge may leak from your breasts called colostrum.   You may have a bloody mucus discharge. This usually occurs a few days to a week before labor begins.  HOME CARE INSTRUCTIONS   Keep your caregiver's appointments. Follow your caregiver's instructions regarding medication use, exercise, and diet.   During pregnancy, you are providing food for you and your baby. Continue to eat regular, well-balanced meals. Choose foods such as meat, fish, milk and other low fat dairy products, vegetables, fruits, and whole-grain breads and cereals. Your caregiver will tell you of the ideal weight gain.   A physical sexual relationship may be continued throughout pregnancy if there are no other problems such as early (premature) leaking of amniotic fluid from the membranes, vaginal bleeding, or belly (abdominal) pain.   Exercise regularly if there are no restrictions. Check with your caregiver if you are unsure of the safety of your exercises. Greater weight gain will occur in the last 2 trimesters of pregnancy. Exercising helps:   Control your weight.   Get you in shape for labor and delivery.   You lose weight after you deliver.   Rest a lot with legs elevated, or as needed for leg cramps or low back pain.   Wear a good support or jogging bra for breast tenderness during  pregnancy. This may help if worn during sleep. Pads or tissues may be used in the bra if you are leaking colostrum.   Do not use hot tubs, steam rooms, or saunas.   Wear your seat belt when driving. This protects you and your baby if you are in an accident.   Avoid raw meat, cat litter boxes and soil used by cats. These carry germs that can cause birth defects in the baby.   It is easier to loose urine during pregnancy. Tightening up and strengthening the pelvic muscles will help with this problem. You can practice stopping your urination while you are going to the bathroom. These are the same muscles you need to strengthen. It is also the muscles you would use if you were trying to stop from passing gas. You can practice tightening these muscles up 10 times a set and repeating this about 3 times per day. Once you know what muscles to tighten up, do not perform these exercises during urination. It is more likely   to cause an infection by backing up the urine.   Ask for help if you have financial, counseling or nutritional needs during pregnancy. Your caregiver will be able to offer counseling for these needs as well as refer you for other special needs.   Make a list of emergency phone numbers and have them available.   Plan on getting help from family or friends when you go home from the hospital.   Make a trial run to the hospital.   Take prenatal classes with the father to understand, practice and ask questions about the labor and delivery.   Prepare the baby's room/nursery.   Do not travel out of the city unless it is absolutely necessary and with the advice of your caregiver.   Wear only low or no heal shoes to have better balance and prevent falling.  MEDICATIONS AND DRUG USE IN PREGNANCY  Take prenatal vitamins as directed. The vitamin should contain 1 milligram of folic acid. Keep all vitamins out of reach of children. Only a couple vitamins or tablets containing iron may be fatal  to a baby or young child when ingested.   Avoid use of all medications, including herbs, over-the-counter medications, not prescribed or suggested by your caregiver. Only take over-the-counter or prescription medicines for pain, discomfort, or fever as directed by your caregiver. Do not use aspirin, ibuprofen (Motrin, Advil, Nuprin) or naproxen (Aleve) unless OK'd by your caregiver.   Let your caregiver also know about herbs you may be using.   Alcohol is related to a number of birth defects. This includes fetal alcohol syndrome. All alcohol, in any form, should be avoided completely. Smoking will cause low birth rate and premature babies.   Street/illegal drugs are very harmful to the baby. They are absolutely forbidden. A baby born to an addicted mother will be addicted at birth. The baby will go through the same withdrawal an adult does.  SEEK MEDICAL CARE IF: You have any concerns or worries during your pregnancy. It is better to call with your questions if you feel they cannot wait, rather than worry about them. DECISIONS ABOUT CIRCUMCISION You may or may not know the sex of your baby. If you know your baby is a boy, it may be time to think about circumcision. Circumcision is the removal of the foreskin of the penis. This is the skin that covers the sensitive end of the penis. There is no proven medical need for this. Often this decision is made on what is popular at the time or based upon religious beliefs and social issues. You can discuss these issues with your caregiver or pediatrician. SEEK IMMEDIATE MEDICAL CARE IF:   An unexplained oral temperature above 102 F (38.9 C) develops, or as your caregiver suggests.   You have leaking of fluid from the vagina (birth canal). If leaking membranes are suspected, take your temperature and tell your caregiver of this when you call.   There is vaginal spotting, bleeding or passing clots. Tell your caregiver of the amount and how many pads are  used.   You develop a bad smelling vaginal discharge with a change in the color from clear to white.   You develop vomiting that lasts more than 24 hours.   You develop chills or fever.   You develop shortness of breath.   You develop burning on urination.   You loose more than 2 pounds of weight or gain more than 2 pounds of weight or as suggested by your   caregiver.   You notice sudden swelling of your face, hands, and feet or legs.   You develop belly (abdominal) pain. Round ligament discomfort is a common non-cancerous (benign) cause of abdominal pain in pregnancy. Your caregiver still must evaluate you.   You develop a severe headache that does not go away.   You develop visual problems, blurred or double vision.   If you have not felt your baby move for more than 1 hour. If you think the baby is not moving as much as usual, eat something with sugar in it and lie down on your left side for an hour. The baby should move at least 4 to 5 times per hour. Call right away if your baby moves less than that.   You fall, are in a car accident or any kind of trauma.   There is mental or physical violence at home.  Document Released: 03/25/2001 Document Revised: 12/11/2010 Document Reviewed: 09/27/2008 [employer] Patient Information 2012 [employer]. Birth Control Choices Birth control is the use of any practices, methods, or devices to prevent pregnancy from happening in a sexually active woman.  Below are some birth control choices to help avoid pregnancy.  Not having sex (abstinence) is the surest form of birth control. This requires self-control. There is no risk of acquiring a sexually transmitted disease (STD), including acquired immunodeficiency syndrome (AIDS).   Periodic abstinence requires self-control during certain times of the month.   Calendar method, timing your menstrual periods from month to month.   Ovulation method is avoiding sexual intercourse around the time  you produce an egg (ovulate).   Symptotherm method is avoiding sexual intercourse at the time of ovulation, using a thermometer and ovulation symptoms.   Post ovulation method is the timing of sexual intercourse after you ovulated.  These methods do not protect against STDs, including AIDS.  Birth control pills (BCPs) contain estrogen and progesterone hormone. These medicines work by stopping the egg from forming in the ovary (ovulation). Birth control pills are prescribed by a caregiver who will ask you questions about the risks of taking BCPs. Birth control pills do not protect against STDs, including AIDS.   "Minipill" birth control pills have only the progesterone hormone. They are taken every day of each month and must be prescribed by your caregiver. They do not protect against STDs, including AIDS.   Emergency contraception is often call the "morning after" pill. This pill can be taken right after sex or up to five days after sex if you think your birth control failed, you failed to use contraception, or you were forced to have sex. It is most effective the sooner you take the pills after having sexual intercourse. Do not use emergency contraception as your only form of birth control. Emergency contraceptive pills are available without a prescription. Check with your pharmacist.   Condoms are a thin sheath of latex, synthetic material, or lambskin worn over the penis during sexual intercourse. They can have a spermicide in or on them when you buy them. Latex condoms can prevent pregnancy and STDs. "Natural" or lambskin condoms can prevent pregnancy but may not protect against STDs, including AIDS.   Female condoms are a soft, loose-fitting sheath that is put into the vagina before sexual intercourse. They can prevent pregnancy and STDs, including AIDS.   Sponge is a soft, circular piece of polyurethane foam with spermicide in it that is inserted into the vagina after wetting it and before  sexual intercourse. It does   not require a prescription from your caregiver. It does not protect against STDs, including AIDS.   Diaphragm is a soft, latex, dome-shaped barrier that must be fitted by a caregiver. It is inserted into the vagina, along with a spermicidal jelly. After the proper fitting for a diaphragm, always insert the diaphragm before intercourse. The diaphragm should be left in the vagina for 6 to 8 hours after intercourse. Removal and reinsertion with a spermicide is always necessary after any use. It does not protect against STDs, including AIDS.   Progesterone-only injections are given every 3 months to prevent pregnancy. These injections contain synthetic progesterone and no estrogen. This hormone stops the ovaries from releasing eggs. It also causes the cervical mucus to thicken and changes the uterine lining. This makes it harder for sperm to survive in the uterus. It does not protect against STDs, including AIDS.   Birth Control Patch contains hormones similar to those in birth control pills, so effectiveness, risks, and side effects are similar. It must be changed once a week and is prescribed by a caregiver. It is less effective in very overweight women. It does not protect against STDs, including AIDS.   Vaginal Ring contains hormones similar to those in birth control pills. It is left in place for 3 weeks, removed for 1 week, and then a new one is put back into the vagina. It comes with a timer to put in your purse to help you remember when to take it out or put a new one in. A caregiver's examination and prescription is necessary, just like with birth control pills and the patch. It does not protect against STDs, including AIDS.   Estrogen plus progesterone injections are given every 28 to 30 days. They can be given in the upper arm, thigh, or buttocks. It does not protect against STDs, including AIDS.   Intrauterine device (IUD): copper T or progestin filled is a T-shaped  device that is put in a woman's uterus during a menstrual period to prevent pregnancy. The copper T IUD can last 10 years, and the progestin IUD can last 5 years. The progestin IUD can also help control heavy menstrual periods. It does not protect against STDs, including AIDS. The copper T IUD can be used as emergency contraception if inserted within 5 days of having unprotected intercourse.   Cervical cap is a round, soft latex or plastic cup that fits over the cervix and must be fitted by a caregiver. You do not need to use a spermicide with it or remove and insert it every time you have sexual intercourse. It does not protect against STDs, including AIDS.   Spermicides are chemicals that kill or block sperm from entering the cervix and uterus. They come in the form of creams, jellies, suppositories, foam, or tablets, and they do not require a prescription. They are inserted into the vagina with an applicator before having sexual intercourse. This must be repeated every time you have sexual intercourse.   Withdrawal is using the method of the female withdrawing his penis from sexual intercourse before he has a climax and deposits his sperm. It does not protect against STDs, including AIDS.   Female tubal ligation is when the woman's fallopian tubes are surgically sealed or tied to prevent the egg from traveling to the uterus. It does not protect against STDs, including AIDS.   Female sterilization is when the female has his tubes that carry sperm tied off (vasectomy) to stop sperm from entering the   vagina during sexual intercourse. It does not protect against STDs, including AIDS.  Regardless of which method of birth control you choose, it is still important that you use some form of protection against STDs. Document Released: 03/31/2005 Document Revised: 05/03/2010 Document Reviewed: 02/15/2009 [employer] Patient Information 2012 [employer]. Postpartum Depression After delivery, your body is going  through a drastic change in hormone levels. You may find yourself crying for no apparent reason and unable to cope with all the changes a new baby brings. This is a common response following a pregnancy. Seek support from your partner and/or friends and just give yourself time to recover. If these feelings persist and you feel you are getting worse, contact your caregiver or other professionals who can help you. WHAT IS DEPRESSION? Depression can be described as feeling sad, blue, unhappy, miserable, or down in the dumps. Most of us feel this way at one time or another for short periods. But true clinical depression is a mood disorder in which feelings of sadness, loss, anger, fear, or frustration interfere with everyday life for an extended time. Depression can be mild, moderate, or severe. The degree of depression, which your caregiver can determine, influences your treatment. Postpartum depression occurs within a couple days to months after delivering your baby. HOW COMMON IS DEPRESSION DURING AND AFTER PREGNANCY? Depression that occurs during pregnancy or within a year after delivery is called perinatal depression. Depression after pregnancy is also called postpartum depression or peripartum depression. The exact number of women with depression during this time is unknown, but it occurs in between 10-15% of women. Researchers believe that depression is one of the most common complications during and after pregnancy. The depression is often not recognized or treated, because some normal pregnancy changes cause similar symptoms and are happening at the same time. Tiredness, problems sleeping, stronger emotional reactions, and changes in body weight may occur during and after pregnancy. But these symptoms may also be signs of depression.  CAUSES  Rapid hormone changes. Estrogen and progesterone usually decrease immediately after delivering your baby. Researchers think the fast change in hormone levels may  lead to depression, just as smaller changes in hormones can affect a woman's moods before she gets her menstrual period.   Decrease in thyroid hormone. Thyroid hormone regulates how your body uses and stores energy from food (metabolism). A simple blood test can tell if this condition is causing a woman's depression. If so, thyroid medicine can be prescribed by your caregiver.   A stressful life event, such as a death in the family. This can cause chemical changes in the brain that lead to depression.   Feeling overwhelmed by caring for and raising a new baby.   Depression is also an illness that runs in some families. It is not always clear what causes depression.  FACTORS THAT MAY INCREASE A WOMAN'S CHANCE OF DEPRESSION DURING PREGNANCY:  History of depression.   Substance abuse, alcohol, or drugs.   Little support from family and friends.   Problems with previous pregnancy or birth.   Young age for motherhood.   Living alone.   Little or no social support.   Family history of mental illness.   Anxiety about the fetus.   Marital or financial problems.   Postpartum depression in a previous pregnancy.   Having a psychiatric illness (schizophrenia, bipolar disorder).   Going through a difficult or stressful pregnancy.   Going through a difficult labor and delivery.   Moving to another city   or state during your pregnancy, or just after delivering your baby.  OTHER FACTORS THAT MAY CONTRIBUTE TO POSTPARTUM DEPRESSION INCLUDE:   Feeling tired after delivery, broken sleep patterns, and not getting enough rest. This often keeps a new mother from regaining her full strength for weeks.   Feeling overwhelmed with a new baby to take care of and doubting your ability to be a good mother.   Feeling stress from changes in work and home routines. Women sometimes think they need to be "super mom" or perfect. This is not realistic and can add stress.   Having feelings of loss. This  can include loss of the identity of who you are, or were, before having the baby, loss of control, loss of your pre-pregnancy figure, and feeling less attractive.   Having less free time and less control over your time. Needing to stay home, indoors, for longer periods of time and having less time to spend with your partner and loved ones can contribute to depression.   Having trouble doing your daily activities at home or at work.   Fears about not knowing how to take of the baby correctly and about harming the baby.   Feelings of guilt that you are not taking care of the baby properly.  SYMPTOMS Any of these symptoms, during and after pregnancy, that last longer than 2 weeks are signs of depression:  Feeling restless or irritable.   Feeling sad, hopeless, and overwhelmed.   Crying a lot.   Having no energy or motivation.   Eating too little or too much.   Sleeping too little or too much.   Trouble focusing, remembering, or making decisions.   Feeling worthless and guilty.   Loss of interest or pleasure in activities.   Withdrawal from friends and family.   Having headaches, chest pains, rapid or irregular heartbeat (palpitations), or fast and shallow breathing (hyperventilation).   After pregnancy, being afraid of hurting the baby or oneself, and not having any interest in the baby.   Not being able to care for yourself or the baby.   Loss of interest in caring for the baby.   Anxiety and panic attacks.   Thoughts of harming yourself, the baby, or someone else.   Feelings of guilt because you feel you are not taking care of the baby well enough.  WHAT IS THE DIFFERENCE BETWEEN "BABY BLUES," POSTPARTUM DEPRESSION, AND POSTPARTUM PSYCHOSIS?  The "baby blues" occurs 70 to 80% of the time, and it can happen in the days right after childbirth. It normally goes away within a few days to a week. A new mother can have sudden mood swings, sadness, crying spells, loss of  appetite, sleeping problems, and feel irritable, restless, anxious, and lonely. Symptoms are not severe and treatment usually is not needed. But there are things you can do to feel better. Nap when the baby does. Ask for help from your spouse, family members, and friends. Join a support group of new moms or talk with other moms. If the "baby blues" does not go away in a week to 10 days or gets worse, you may have postpartum depression.   Postpartum depression can happen anytime within the first year after childbirth. A woman may have a number of symptoms, such as sadness, lack of energy, trouble concentrating, anxiety, and feelings of guilt and worthlessness. The difference between postpartum depression and the "baby blues" is that the feelings in postpartum depression are much stronger and often affects a   woman's well-being. It keeps her from functioning well for a longer period of time. Postpartum depression needs to be treated by a caregiver. Counseling, support groups, and medicines can help.   Postpartum psychosis is rare. It occurs in 1 or 2 out of every 1000 births. It usually begins in the first 6 weeks after delivery. Women who have bipolar disorder, schizoaffective disorder, or family history of psychotic disease have a higher risk for developing postpartum psychosis. Symptoms may include delusions, hallucinations, sleep disturbances, and obsessive thoughts about the baby. A woman may have rapid mood swings, from depression, to irritability, to euphoria. This is a serious condition and needs professional care and treatment.  WHAT STEPS CAN I TAKE IF I HAVE SYMPTOMS OF DEPRESSION DURING PREGNANCY OR AFTER CHILDBIRTH?  Some women do not tell anyone about their symptoms, because they feel embarrassed, ashamed, or guilty about feeling depressed when they are supposed to be happy. They worry that they will be viewed as unfit parents. Perinatal depression can happen to any woman. It does not mean you are  a bad or a "not together" mom. You and your baby do not need to suffer. There is help. You should discuss these feelings with your spouse or partner, family, and caregiver.   There are different types of individual and group "talk therapies" that can help a woman with perinatal depression feel better and do better as a mom and as a person. Limited research suggests that many women with perinatal depression improve when treated with antidepressant medicine. Your caregiver can help you learn more about these options and decide which approach is best for you and your baby.   Speak to your caregiver if you are having symptoms of depression while you are pregnant or after you deliver your baby. Your caregiver can give you a questionnaire to test for depression. You can also be referred to a mental health professional who specializes in treating depression.  HOME CARE INSTRUCTIONS  Try to get as much rest as you can. Try to nap when the baby naps.   Stop putting pressure on yourself to do everything. Do as much as you can and leave the rest.   Ask for help with household chores and nighttime feedings. Ask your partner to bring the baby to you so you can breastfeed. If you can, have a friend, family member, or professional support person help you in the home for part of the day.   Talk to your partner, family, and friends about how you are feeling.   Do not spend a lot of time alone. Get dressed and leave the house. Run an errand or take a short walk.   Spend time alone with your partner.   Talk with other mothers so you can learn from their experiences.   Join a support group for women with depression. Call a local hotline or look in your telephone book for information and services.   Do not make any major life changes during pregnancy. Major changes can cause unneeded stress. However, sometimes big changes cannot be avoided. Arrange support and help in your new situation ahead of time.   Exercise  regularly.   Eat a balanced and nourishing diet.   Seek help if there are marital or financial problems.   Take the medicine your caregiver gives, as directed.   Keep all your postpartum appointments.  TREATMENT There are 2 common types of treatment for depression.  Talk therapy. This involves talking to a therapist, psychologist, clergyperson,   or social worker, in order to learn to change how depression makes you think, feel, and act.   Medicine. Your caregiver can give you an antidepressant medicine to help you. These medicines can help relieve the symptoms of depression.   Women who are pregnant or breast-feeding should talk with their caregivers about the advantages and risks of taking antidepressant medicines. Some women are concerned that taking these medicines may harm the baby. A mother's depression can affect her baby's development. Getting treatment is important for both mother and baby. The risks of taking medicine must be weighed against the risks of depression. It is a decision that women need to discuss carefully with their caregivers. Women who decide to take antidepressant medicines should talk to their caregivers about which antidepressant medicines are safer to take while pregnant or breastfeeding.  What effects can untreated depression have?  Depression not only hurts the mother, but it also affects her family. Some researchers have found that depression during pregnancy can raise the risk of delivering an underweight baby or a premature infant. Some women with depression have difficulty caring for themselves during pregnancy. They may have trouble eating and do not gain enough weight during the pregnancy. They may also have trouble sleeping, may miss prenatal visits, may not follow medical instructions, have a poor diet, or may use harmful substances, like tobacco, alcohol, or illegal drugs.   Postpartum depression can affect a mother's ability to parent. She may lack energy,  have trouble concentrating, be irritable, and not be able to meet her child's needs for love and affection. As a result, she may feel guilty and lose confidence in herself as a mother. This can make the depression worse. Researchers believe that postpartum depression can affect the infant by causing delays in language development, problems with emotional bonding to others, behavioral problems, lower activity levels, sleep problems, and distress. It helps if the father or another caregiver can assist in meeting the needs of the baby, and other children in the family, while the mother is depressed.   All children deserve the chance to have a healthy mom. All moms deserve the chance to enjoy their life and their children. Do not suffer alone. If you are experiencing symptoms of depression during pregnancy or after having a baby, tell a loved one and call your caregiver right away.  SEEK MEDICAL CARE IF:  You think you have postpartum depression.   You want medicine to treat your postpartum depression.   You want a referral to a psychiatrist or psychologist.   You are having a reaction or problems with your medicine.  SEEK IMMEDIATE MEDICAL CARE IF:  You have suicidal feelings.   You feel you may harm the baby.   You feel you may harm your spouse/partner, or someone else.   You feel you need to be admitted to a hospital now.   You feel you are losing control and need treatment immediately.  FOR MORE INFORMATION National Women's Health Information Center: www.womenshealth.gov National Institute of Mental Health, NIH, HHS: www.nimh.nih.gov American Psychological Association: www.apa.org  Postpartum Education for Parents: www.sbpep.org National Mental Health Information Center, SAMHSA, HHS: www.mentalhealth.org  National Mental Health Association: www.nmha.org Postpartum Support International: www.postpartum.net  Document Released: 01/03/2004 Document Revised: 12/11/2010 Document Reviewed:  04/12/2009 ExitCare Patient Information 2012 [employer]. Breastfeeding BENEFITS OF BREASTFEEDING For the baby  The first milk (colostrum) helps the baby's digestive system function better.   There are antibodies from the mother in the milk that help the   baby fight off infections.   The baby has a lower incidence of asthma, allergies, and SIDS (sudden infant death syndrome).   The nutrients in breast milk are better than formulas for the baby and helps the baby's brain grow better.   Babies who breastfeed have less gas, colic, and constipation.  For the mother  Breastfeeding helps develop a very special bond between mother and baby.   It is more convenient, always available at the correct temperature and cheaper than formula feeding.   It burns calories in the mother and helps with losing weight that was gained during pregnancy.   It makes the uterus contract back down to normal size faster and slows bleeding following delivery.   Breastfeeding mothers have a lower risk of developing breast cancer.  NURSE FREQUENTLY  A healthy, full-term baby may breastfeed as often as every hour or space his or her feedings to every 3 hours.   How often to nurse will vary from baby to baby. Watch your baby for signs of hunger, not the clock.   Nurse as often as the baby requests, or when you feel the need to reduce the fullness of your breasts.   Awaken the baby if it has been 3 to 4 hours since the last feeding.   Frequent feeding will help the mother make more milk and will prevent problems like sore nipples and engorgement of the breasts.  BABY'S POSITION AT THE BREAST  Whether lying down or sitting, be sure that the baby's tummy is facing your tummy.   Support the breast with 4 fingers underneath the breast and the thumb above. Make sure your fingers are well away from the nipple and baby's mouth.   Stroke the baby's lips and cheek closest to the breast gently with your finger or  nipple.   When the baby's mouth is open wide enough, place all of your nipple and as much of the dark area around the nipple as possible into your baby's mouth.   Pull the baby in close so the tip of the nose and the baby's cheeks touch the breast during the feeding.  FEEDINGS  The length of each feeding varies from baby to baby and from feeding to feeding.   The baby must suck about 2 to 3 minutes for your milk to get to him or her. This is called a "let down." For this reason, allow the baby to feed on each breast as long as he or she wants. Your baby will end the feeding when he or she has received the right balance of nutrients.   To break the suction, put your finger into the corner of the baby's mouth and slide it between his or her gums before removing your breast from his or her mouth. This will help prevent sore nipples.  REDUCING BREAST ENGORGEMENT  In the first week after your baby is born, you may experience signs of breast engorgement. When breasts are engorged, they feel heavy, warm, full, and may be tender to the touch. You can reduce engorgement if you:   Nurse frequently, every 2 to 3 hours. Mothers who breastfeed early and often have fewer problems with engorgement.   Place light ice packs on your breasts between feedings. This reduces swelling. Wrap the ice packs in a lightweight towel to protect your skin.   Apply moist hot packs to your breast for 5 to 10 minutes before each feeding. This increases circulation and helps the milk flow.     Gently massage your breast before and during the feeding.   Make sure that the baby empties at least one breast at every feeding before switching sides.   Use a breast pump to empty the breasts if your baby is sleepy or not nursing well. You may also want to pump if you are returning to work or or you feel you are getting engorged.   Avoid bottle feeds, pacifiers or supplemental feedings of water or juice in place of breastfeeding.    Be sure the baby is latched on and positioned properly while breastfeeding.   Prevent fatigue, stress, and anemia.   Wear a supportive bra, avoiding underwire styles.   Eat a balanced diet with enough fluids.  If you follow these suggestions, your engorgement should improve in 24 to 48 hours. If you are still experiencing difficulty, call your lactation consultant or caregiver. IS MY BABY GETTING ENOUGH MILK? Sometimes, mothers worry about whether their babies are getting enough milk. You can be assured that your baby is getting enough milk if:  The baby is actively sucking and you hear swallowing.   The baby nurses at least 8 to 12 times in a 24 hour time period. Nurse your baby until he or she unlatches or falls asleep at the first breast (at least 10 to 20 minutes), then offer the second side.   The baby is wetting 5 to 6 disposable diapers (6 to 8 cloth diapers) in a 24 hour period by 5 to 6 days of age.   The baby is having at least 2 to 3 stools every 24 hours for the first few months. Breast milk is all the food your baby needs. It is not necessary for your baby to have water or formula. In fact, to help your breasts make more milk, it is best not to give your baby supplemental feedings during the early weeks.   The stool should be soft and yellow.   The baby should gain 4 to 7 ounces per week after he is 4 days old.  TAKE CARE OF YOURSELF Take care of your breasts by:  Bathing or showering daily.   Avoiding the use of soaps on your nipples.   Start feedings on your left breast at one feeding and on your right breast at the next feeding.   You will notice an increase in your milk supply 2 to 5 days after delivery. You may feel some discomfort from engorgement, which makes your breasts very firm and often tender. Engorgement "peaks" out within 24 to 48 hours. In the meantime, apply warm moist towels to your breasts for 5 to 10 minutes before feeding. Gentle massage and  expression of some milk before feeding will soften your breasts, making it easier for your baby to latch on. Wear a well fitting nursing bra and air dry your nipples for 10 to 15 minutes after each feeding.   Only use cotton bra pads.   Only use pure lanolin on your nipples after nursing. You do not need to wash it off before nursing.  Take care of yourself by:   Eating well-balanced meals and nutritious snacks.   Drinking milk, fruit juice, and water to satisfy your thirst (about 8 glasses a day).   Getting plenty of rest.   Increasing calcium in your diet (1200 mg a day).   Avoiding foods that you notice affect the baby in a bad way.  SEEK MEDICAL CARE IF:   You have any questions or difficulty   with breastfeeding.   You need help.   You have a hard, red, sore area on your breast, accompanied by a fever of 100.5 F (38.1 C) or more.   Your baby is too sleepy to eat well or is having trouble sleeping.   Your baby is wetting less than 6 diapers per day, by 5 days of age.   Your baby's skin or white part of his or her eyes is more yellow than it was in the hospital.   You feel depressed.  Document Released: 03/31/2005 Document Revised: 12/11/2010 Document Reviewed: 11/13/2008 ExitCare Patient Information 2012 [employer]. 

## 2011-04-22 NOTE — Progress Notes (Signed)
Doing well.  No s/sx's of PTL.

## 2011-04-25 ENCOUNTER — Telehealth: Payer: Self-pay | Admitting: *Deleted

## 2011-04-25 DIAGNOSIS — B373 Candidiasis of vulva and vagina: Secondary | ICD-10-CM

## 2011-04-25 MED ORDER — FLUCONAZOLE 150 MG PO TABS: ORAL_TABLET | ORAL | Status: DC

## 2011-04-25 NOTE — Telephone Encounter (Signed)
Patient is complaining of vaginal irritation and itching.  She recently had an antibiotic.  I will call in meds for a yeast infection.  Diflucan 150mg .

## 2011-04-26 ENCOUNTER — Encounter (HOSPITAL_COMMUNITY): Payer: Self-pay | Admitting: Obstetrics and Gynecology

## 2011-04-26 ENCOUNTER — Inpatient Hospital Stay (HOSPITAL_COMMUNITY): Payer: Medicaid Other | Admitting: Anesthesiology

## 2011-04-26 ENCOUNTER — Inpatient Hospital Stay (HOSPITAL_COMMUNITY)
Admission: AD | Admit: 2011-04-26 | Discharge: 2011-04-28 | DRG: 775 | Disposition: A | Discharge status: Home / Self Care | Payer: Medicaid Other | Source: Ambulatory Visit | Attending: Obstetrics & Gynecology | Admitting: Obstetrics & Gynecology

## 2011-04-26 ENCOUNTER — Encounter (HOSPITAL_COMMUNITY): Payer: Self-pay | Admitting: Anesthesiology

## 2011-04-26 DIAGNOSIS — O4290 Premature rupture of membranes, unspecified as to length of time between rupture and onset of labor, unspecified weeks of gestation: Principal | ICD-10-CM | POA: Diagnosis present

## 2011-04-26 DIAGNOSIS — Z3480 Encounter for supervision of other normal pregnancy, unspecified trimester: Secondary | ICD-10-CM

## 2011-04-26 DIAGNOSIS — A546 Gonococcal infection of anus and rectum: Secondary | ICD-10-CM | POA: Clinically undetermined

## 2011-04-26 DIAGNOSIS — O42913 Preterm premature rupture of membranes, unspecified as to length of time between rupture and onset of labor, third trimester: Secondary | ICD-10-CM

## 2011-04-26 LAB — CBC
HCT: 36.9 % (ref 36.0–46.0)
Hemoglobin: 12.7 g/dL (ref 12.0–15.0)
MCHC: 34.4 g/dL (ref 30.0–36.0)
RBC: 3.68 MIL/uL — ABNORMAL LOW (ref 3.87–5.11)
WBC: 9.5 10*3/uL (ref 4.0–10.5)

## 2011-04-26 LAB — RPR: RPR Ser Ql: NONREACTIVE

## 2011-04-26 LAB — POCT FERN TEST: Fern Test: POSITIVE

## 2011-04-26 MED ORDER — ONDANSETRON HCL 4 MG PO TABS: 4.0000 mg | ORAL_TABLET | ORAL | Status: DC | PRN

## 2011-04-26 MED ORDER — WITCH HAZEL-GLYCERIN EX PADS: 1.0000 "application " | MEDICATED_PAD | CUTANEOUS | Status: DC | PRN

## 2011-04-26 MED ORDER — OXYTOCIN BOLUS FROM INFUSION
500.0000 mL | Freq: Once | INTRAVENOUS | Status: DC
  Filled 2011-04-26: qty 1000
  Filled 2011-04-26: qty 500

## 2011-04-26 MED ORDER — TETANUS-DIPHTH-ACELL PERTUSSIS 5-2.5-18.5 LF-MCG/0.5 IM SUSP
0.5000 mL | Freq: Once | INTRAMUSCULAR | Status: AC
  Administered 2011-04-28: 0.5 mL via INTRAMUSCULAR
  Filled 2011-04-26: qty 0.5

## 2011-04-26 MED ORDER — OXYCODONE-ACETAMINOPHEN 5-325 MG PO TABS
1.0000 | ORAL_TABLET | ORAL | Status: DC | PRN
  Administered 2011-04-26 – 2011-04-28 (×2): 1 via ORAL
  Filled 2011-04-26 (×2): qty 1

## 2011-04-26 MED ORDER — LIDOCAINE HCL 1.5 % IJ SOLN
INTRAMUSCULAR | Status: DC | PRN
  Administered 2011-04-26 (×2): 5 mL via EPIDURAL

## 2011-04-26 MED ORDER — LANOLIN HYDROUS EX OINT: TOPICAL_OINTMENT | CUTANEOUS | Status: DC | PRN

## 2011-04-26 MED ORDER — FLEET ENEMA 7-19 GM/118ML RE ENEM: 1.0000 | ENEMA | RECTAL | Status: DC | PRN

## 2011-04-26 MED ORDER — DIPHENHYDRAMINE HCL 50 MG/ML IJ SOLN: 12.5000 mg | INTRAMUSCULAR | Status: DC | PRN

## 2011-04-26 MED ORDER — NALBUPHINE SYRINGE 5 MG/0.5 ML
5.0000 mg | INJECTION | INTRAMUSCULAR | Status: DC | PRN
  Administered 2011-04-26 (×2): 5 mg via INTRAVENOUS
  Filled 2011-04-26: qty 1

## 2011-04-26 MED ORDER — OXYTOCIN 20 UNITS IN LACTATED RINGERS INFUSION - SIMPLE: 125.0000 mL/h | Freq: Once | INTRAVENOUS | Status: DC

## 2011-04-26 MED ORDER — HYDROXYZINE HCL 50 MG PO TABS: 50.0000 mg | ORAL_TABLET | Freq: Four times a day (QID) | ORAL | Status: DC | PRN

## 2011-04-26 MED ORDER — ONDANSETRON HCL 4 MG/2ML IJ SOLN: 4.0000 mg | INTRAMUSCULAR | Status: DC | PRN

## 2011-04-26 MED ORDER — DIPHENHYDRAMINE HCL 25 MG PO CAPS: 25.0000 mg | ORAL_CAPSULE | Freq: Four times a day (QID) | ORAL | Status: DC | PRN

## 2011-04-26 MED ORDER — HYDROXYZINE HCL 50 MG/ML IM SOLN: 50.0000 mg | Freq: Four times a day (QID) | INTRAMUSCULAR | Status: DC | PRN

## 2011-04-26 MED ORDER — SENNOSIDES-DOCUSATE SODIUM 8.6-50 MG PO TABS
2.0000 | ORAL_TABLET | Freq: Every day | ORAL | Status: DC
  Administered 2011-04-27: 2 via ORAL

## 2011-04-26 MED ORDER — PENICILLIN G POTASSIUM 5000000 UNITS IJ SOLR
2.5000 10*6.[IU] | INTRAVENOUS | Status: DC
  Administered 2011-04-26: 2.5 10*6.[IU] via INTRAVENOUS
  Filled 2011-04-26 (×4): qty 2.5

## 2011-04-26 MED ORDER — PHENYLEPHRINE 40 MCG/ML (10ML) SYRINGE FOR IV PUSH (FOR BLOOD PRESSURE SUPPORT)
80.0000 ug | PREFILLED_SYRINGE | INTRAVENOUS | Status: DC | PRN
  Filled 2011-04-26: qty 5

## 2011-04-26 MED ORDER — ZOLPIDEM TARTRATE 5 MG PO TABS: 5.0000 mg | ORAL_TABLET | Freq: Every evening | ORAL | Status: DC | PRN

## 2011-04-26 MED ORDER — PRENATAL MULTIVITAMIN CH
1.0000 | ORAL_TABLET | Freq: Every day | ORAL | Status: DC
  Administered 2011-04-27 – 2011-04-28 (×2): 1 via ORAL
  Filled 2011-04-26 (×2): qty 1

## 2011-04-26 MED ORDER — LACTATED RINGERS IV SOLN: 500.0000 mL | INTRAVENOUS | Status: DC | PRN

## 2011-04-26 MED ORDER — EPHEDRINE 5 MG/ML INJ
10.0000 mg | INTRAVENOUS | Status: DC | PRN
  Filled 2011-04-26: qty 4

## 2011-04-26 MED ORDER — FENTANYL 2.5 MCG/ML BUPIVACAINE 1/10 % EPIDURAL INFUSION (WH - ANES)
14.0000 mL/h | INTRAMUSCULAR | Status: DC
  Administered 2011-04-26: 12 mL/h via EPIDURAL
  Filled 2011-04-26: qty 60

## 2011-04-26 MED ORDER — SIMETHICONE 80 MG PO CHEW: 80.0000 mg | CHEWABLE_TABLET | ORAL | Status: DC | PRN

## 2011-04-26 MED ORDER — OXYCODONE-ACETAMINOPHEN 5-325 MG PO TABS: 2.0000 | ORAL_TABLET | ORAL | Status: DC | PRN

## 2011-04-26 MED ORDER — IBUPROFEN 600 MG PO TABS
600.0000 mg | ORAL_TABLET | Freq: Four times a day (QID) | ORAL | Status: DC
  Administered 2011-04-27 – 2011-04-28 (×7): 600 mg via ORAL
  Filled 2011-04-26 (×7): qty 1

## 2011-04-26 MED ORDER — NALOXONE HCL 0.4 MG/ML IJ SOLN
INTRAMUSCULAR | Status: AC
  Filled 2011-04-26: qty 1

## 2011-04-26 MED ORDER — PHENYLEPHRINE 40 MCG/ML (10ML) SYRINGE FOR IV PUSH (FOR BLOOD PRESSURE SUPPORT): 80.0000 ug | PREFILLED_SYRINGE | INTRAVENOUS | Status: DC | PRN

## 2011-04-26 MED ORDER — BENZOCAINE-MENTHOL 20-0.5 % EX AERO: 1.0000 "application " | INHALATION_SPRAY | CUTANEOUS | Status: DC | PRN

## 2011-04-26 MED ORDER — LACTATED RINGERS IV SOLN
INTRAVENOUS | Status: DC
  Administered 2011-04-26 (×2): via INTRAVENOUS

## 2011-04-26 MED ORDER — EPHEDRINE 5 MG/ML INJ: 10.0000 mg | INTRAVENOUS | Status: DC | PRN

## 2011-04-26 MED ORDER — ACETAMINOPHEN 325 MG PO TABS: 650.0000 mg | ORAL_TABLET | ORAL | Status: DC | PRN

## 2011-04-26 MED ORDER — IBUPROFEN 600 MG PO TABS: 600.0000 mg | ORAL_TABLET | Freq: Four times a day (QID) | ORAL | Status: DC | PRN

## 2011-04-26 MED ORDER — CITRIC ACID-SODIUM CITRATE 334-500 MG/5ML PO SOLN: 30.0000 mL | ORAL | Status: DC | PRN

## 2011-04-26 MED ORDER — ONDANSETRON HCL 4 MG/2ML IJ SOLN: 4.0000 mg | Freq: Four times a day (QID) | INTRAMUSCULAR | Status: DC | PRN

## 2011-04-26 MED ORDER — DIBUCAINE 1 % RE OINT: 1.0000 "application " | TOPICAL_OINTMENT | RECTAL | Status: DC | PRN

## 2011-04-26 MED ORDER — LIDOCAINE HCL (PF) 1 % IJ SOLN
30.0000 mL | INTRAMUSCULAR | Status: DC | PRN
  Filled 2011-04-26: qty 30

## 2011-04-26 MED ORDER — PENICILLIN G POTASSIUM 5000000 UNITS IJ SOLR
5.0000 10*6.[IU] | Freq: Once | INTRAVENOUS | Status: AC
  Administered 2011-04-26: 5 10*6.[IU] via INTRAVENOUS
  Filled 2011-04-26: qty 5

## 2011-04-26 MED ORDER — BENZOCAINE-MENTHOL 20-0.5 % EX AERO
INHALATION_SPRAY | CUTANEOUS | Status: AC
  Administered 2011-04-26: 21:00:00
  Filled 2011-04-26: qty 56

## 2011-04-26 MED ORDER — LACTATED RINGERS IV SOLN: 500.0000 mL | Freq: Once | INTRAVENOUS | Status: DC

## 2011-04-26 NOTE — Progress Notes (Signed)
Kristen Coffey is a 21 y.o. G1P0000 at [redacted]w[redacted]d by ultrasound admitted for PPROM  Subjective: Uncomfortable with ctx, requesting epidural  Objective: BP 103/44  Pulse 91  Temp(Src) 98 F (36.7 C) (Oral)  Resp 20  Ht 4\' 8"  (1.422 m)  Wt 156 lb 9.6 oz (71.033 kg)  BMI 35.11 kg/m2  LMP 09/06/2010      FHT:  FHR: 120 bpm, variability: moderate,  accelerations:  Present,  decelerations:  Absent UC:   regular, every 2-3 minutes SVE:   Dilation: 4 Effacement (%): 80 Station: -1 Exam by:: Shadana Pry cnm  Labs: Lab Results  Component Value Date   WBC 9.5 04/26/2011   HGB 12.7 04/26/2011   HCT 36.9 04/26/2011   MCV 100.3* 04/26/2011   PLT 214 04/26/2011    Assessment / Plan: Spontaneous labor, progressing normally  Labor: Progressing normally Preeclampsia:  n/a Fetal Wellbeing:  Category I Pain Control:  Epidural to be placed I/D:  PCN for Unkwn GBS <37 weeks Anticipated MOD:  NSVD  Barron Vanloan E. 04/26/2011, 11:46 AM

## 2011-04-26 NOTE — Anesthesia Procedure Notes (Addendum)

## 2011-04-26 NOTE — Anesthesia Preprocedure Evaluation (Signed)
Anesthesia Evaluation  Patient identified by MRN, date of birth, ID band Patient awake    Reviewed: Allergy & Precautions, H&P , Patient's Chart, lab work & pertinent test results  Airway Mallampati: II TM Distance: >3 FB Neck ROM: full    Dental No notable dental hx.    Pulmonary neg pulmonary ROS,  clear to auscultation  Pulmonary exam normal       Cardiovascular neg cardio ROS regular Normal    Neuro/Psych Negative Neurological ROS  Negative Psych ROS   GI/Hepatic negative GI ROS, Neg liver ROS,   Endo/Other  Negative Endocrine ROSMorbid obesity  Renal/GU negative Renal ROS     Musculoskeletal   Abdominal   Peds  Hematology negative hematology ROS (+)   Anesthesia Other Findings   Reproductive/Obstetrics (+) Pregnancy                           Anesthesia Physical Anesthesia Plan  ASA: II  Anesthesia Plan: Epidural   Post-op Pain Management:    Induction:   Airway Management Planned:   Additional Equipment:   Intra-op Plan:   Post-operative Plan:   Informed Consent: I have reviewed the patients History and Physical, chart, labs and discussed the procedure including the risks, benefits and alternatives for the proposed anesthesia with the patient or authorized representative who has indicated his/her understanding and acceptance.     Plan Discussed with:   Anesthesia Plan Comments:         Anesthesia Quick Evaluation

## 2011-04-26 NOTE — Consult Note (Signed)
Called to room 161 to attend a [redacted] week gestation spontaneous vaginal delivery. Mother ruptured membranes this AM and had cerclage removed afterwards. Labor progressed to the present progressing well.    At delivery infant delivered from vertex with no apparent cord complications. Spontaneous cries and good tone observed. Given vigorous tactile stimulation and bulb suction to naso/oropharynx yielding little fluid. Chest clear to A. No dysmorphic features noted.  Infant's weight  5 pounds 3 ounces and maturity factors include raised well developed areolae and with 1/4 cm breast bud, and plantar creases over anterior 1/3 of foot. Apgar scores 8 and 8 at one and five minutes.    Rn and midwife appraised of infant's well status and recommendation to allow contact and bonding with mother for 15 - 20 minutes before transfer to Transitional Nursery for more focused care.  Father and mother appraised of relative risks associated with this degree of prematurity including failure to maintain core temp and hypoglycema as well as potential immaturity of deglutition.    Care transferred to L/D RN and to assigned pediatrician.    Dagoberto Ligas MD [facility]

## 2011-04-26 NOTE — Progress Notes (Signed)
Gush of clear fluid around 5:30 a.m. Still leaking, back pain.

## 2011-04-26 NOTE — Progress Notes (Signed)
Alis Hirota is a 21 y.o. G1P0000 at [redacted]w[redacted]d  Subjective: Comfortable with epidural in place, occasional pressure with ctx  Objective: BP 116/77  Pulse 96  Temp(Src) 97.9 F (36.6 C) (Oral)  Resp 16  Ht 4\' 8"  (1.422 m)  Wt 156 lb 9.6 oz (71.033 kg)  BMI 35.11 kg/m2  SpO2 100%  LMP 09/06/2010      FHT:  FHR: 130 bpm, variability: moderate,  accelerations:  Present,  decelerations:  Absent UC:   regular, every 1-2 minutes SVE:   Dilation: Lip/rim Effacement (%): 100 Station: +1 Exam by:: patti moore rn  Labs: Lab Results  Component Value Date   WBC 9.5 04/26/2011   HGB 12.7 04/26/2011   HCT 36.9 04/26/2011   MCV 100.3* 04/26/2011   PLT 214 04/26/2011    Assessment / Plan: Spontaneous labor, progressing normally  Labor: Progressing normally Preeclampsia:  n/a Fetal Wellbeing:  Category I Pain Control:  Epidural I/D:  PCN for unknown GBS Anticipated MOD:  NSVD  Meztli Llanas E. 04/26/2011, 3:57 PM
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
## 2011-04-27 MED ORDER — HYDROCORTISONE 1 % EX CREA
TOPICAL_CREAM | Freq: Three times a day (TID) | CUTANEOUS | Status: DC
  Administered 2011-04-27: 11:00:00 via TOPICAL
  Filled 2011-04-27: qty 28

## 2011-04-27 NOTE — Anesthesia Postprocedure Evaluation (Signed)
  Anesthesia Post-op Note  Patient: Restaurant manager, [employer]  Procedure(s) Performed: * No procedures listed *  Patient Location: Mother/Baby  Anesthesia Type: Epidural  Level of Consciousness: awake and alert   Airway and Oxygen Therapy: Patient Spontanous Breathing  Post-op Pain: mild  Post-op Assessment: Patient's Cardiovascular Status Stable and Respiratory Function Stable  Post-op Vital Signs: Reviewed and stable  Complications: No apparent anesthesia complications

## 2011-04-27 NOTE — Progress Notes (Signed)
Post Partum Day 1 Subjective: no complaints, up ad lib, voiding, tolerating PO, + flatus and Patient states that she is doing well. She is breast feeding. Baby has had 2 wet diapers already.  She plans to use implantable contraception.  Objective: Blood pressure 95/65, pulse 80, temperature 98.4 F (36.9 C), temperature source Oral, resp. rate 18, height 4\' 8"  (1.422 m), weight 71.033 kg (156 lb 9.6 oz), last menstrual period 09/06/2010, SpO2 97.00%, unknown if currently breastfeeding.  Physical Exam:  General: alert, cooperative, appears stated age and no distress Lochia: appropriate Uterine Fundus: firm Incision: n/a DVT Evaluation: No evidence of DVT seen on physical exam. Negative Homan's sign. Calf/Ankle edema is present.   Basename 04/26/11 0831  HGB 12.7  HCT 36.9    Assessment/Plan: Plan for discharge tomorrow, Breastfeeding and Lactation consult   LOS: 1 day   Arthor Captain 04/27/2011, 9:03 AM

## 2011-04-27 NOTE — Progress Notes (Signed)
I have seen and examined this patient in conjunction with Abigail Harris, PA-S.  I have taken this history and performed the exam.  I agree with the note as written above and have made corrections as needed.   STINSON, JACOB JEHIEL 04/27/2011 10:27 AM   

## 2011-04-28 DIAGNOSIS — A546 Gonococcal infection of anus and rectum: Secondary | ICD-10-CM | POA: Clinically undetermined

## 2011-04-28 LAB — GC/CHLAMYDIA PROBE AMP, GENITAL: GC Probe Amp, Genital: NEGATIVE

## 2011-04-28 MED ORDER — IBUPROFEN 800 MG PO TABS: 800.0000 mg | ORAL_TABLET | Freq: Three times a day (TID) | ORAL | Status: AC | PRN

## 2011-04-28 NOTE — Discharge Summary (Signed)
Obstetric Discharge Summary Reason for Admission: Preterm Premature Rupture of Membranes Prenatal Procedures: NST Intrapartum Procedures: spontaneous vaginal delivery Postpartum Procedures: none Complications-Operative and Postpartum: none Hemoglobin  Date Value Range Status  04/26/2011 12.7  12.0-15.0 (g/dL) Final     HCT  Date Value Range Status  04/26/2011 36.9  36.0-46.0 (%) Final  Hospital Course:  Admitted with Spontaneous rupture of membranes, not in labor.  Progressed to SVD of viable female, who has done well. Postpartum course uneventful. Will d/c home. Cultures (test of cure) are pending.   Discharge Diagnoses: Preterm Labor, Delivered via Spontaneous Vaginal Delivery  Discharge Information: Date: 04/28/2011 Activity: unrestricted and pelvic rest Diet: routine Medications: PNV Condition: stable Instructions: refer to practice specific booklet Discharge to: home Follow-up Information    Follow up with [facility] in 4 weeks.   Contact information:   [address] [place] [address]          Newborn Data: Live born female  Birth Weight: 5 lb 3.4 oz (2364 g) APGAR: 8, 8  Home with mother when OK by Peds.  [facility] 04/28/2011, 9:54 AM

## 2011-04-28 NOTE — Progress Notes (Signed)
Agree with note. 

## 2011-04-28 NOTE — Discharge Summary (Signed)
Obstetric Discharge Summary Reason for Admission: onset of labor Prenatal Procedures: NST Intrapartum Procedures: NA Postpartum Procedures: na Complications-Operative and Postpartum: none Hemoglobin  Date Value Range Status  04/26/2011 12.7  12.0-15.0 (g/dL) Final     HCT  Date Value Range Status  04/26/2011 36.9  36.0-46.0 (%) Final    Discharge Diagnoses: Premature pregnancy-delivered  Discharge Information: Date: 04/28/2011 Activity: unrestricted Diet: routine Medications: PNV and Ibuprofen Condition: stable Instructions: refer to practice specific booklet Discharge to: home Follow-up Information    Follow up with [facility] in 4 weeks.   Contact information:   [address] [place] [address]          Newborn Data: Live born female  Birth Weight: 5 lb 3.4 oz (2364 g) APGAR: 8, 8  Baby in NICU.  Arthor Captain 04/28/2011, 8:57 AM

## 2011-04-28 NOTE — Progress Notes (Signed)
UR chart review completed.  

## 2011-04-28 NOTE — Progress Notes (Signed)
Post Partum Day 2 Subjective: no complaints, up ad lib, voiding, tolerating PO and + flatus  Objective: Blood pressure 98/66, pulse 80, temperature 98.6 F (37 C), temperature source Oral, resp. rate 18, height 4\' 8"  (1.422 m), weight 71.033 kg (156 lb 9.6 oz), last menstrual period 09/06/2010, SpO2 97.00%, unknown if currently breastfeeding.  Physical Exam:  General: alert, appears stated age and no distress Lochia: appropriate Uterine Fundus: firm Incision: NA DVT Evaluation: No evidence of DVT seen on physical exam. Negative Homan's sign. Calf/Ankle edema is present.   Basename 04/26/11 0831  HGB 12.7  HCT 36.9    Assessment/Plan: Discharge home, Breastfeeding and Contraception Patient does not know what her methods of contraception will be. She was uninterested in learning about her options this morning. She plans to get more information at her 4 week f/u  at [facility].   LOS: 2 days   Arthor Captain 04/28/2011, 8:03 AM

## 2011-04-28 NOTE — Discharge Summary (Signed)
Test of Cure cultures were done on 04/26/11 for GC and Chlamydia (pt had rectal GC).  Results are still pending.   WIll d/c home and if they come back positive, we can treat via [facility] office.  Will reinforce Treatment of FOB.

## 2011-05-06 ENCOUNTER — Encounter: Payer: Medicaid Other | Admitting: Obstetrics and Gynecology

## 2011-05-26 ENCOUNTER — Ambulatory Visit (INDEPENDENT_AMBULATORY_CARE_PROVIDER_SITE_OTHER): Payer: Medicaid Other | Admitting: Obstetrics & Gynecology

## 2011-05-26 ENCOUNTER — Encounter: Payer: Self-pay | Admitting: Obstetrics & Gynecology

## 2011-05-26 NOTE — Progress Notes (Signed)
  Subjective:    Patient ID: Kristen Coffey, female    DOB: [birth year], 20 y.o.   MRN: [id number]  HPI  She is here for a pp check. She complains of some "random" vaginal pains that remind her of "the cerclage". She says she has not had sex yet and thinks she wants the Implanon. She says she has heard "bad things" about the Mirena. She likes depo provera, but got pregnant when she forgot to get a shot. She denies any problems with her bowel or bladder. She denies pp blues and scored 2 on the test.  Review of Systems No tears with delivery    Objective:   Physical Exam   Normal perineum, vagina, and bimanual     Assessment & Plan:  Post partum- stable She will come back in a week for either an Implanon/Nexplanon or a Mirena.  She will need a pap when she turns 21 (Dec 2013).

## 2011-06-26 ENCOUNTER — Encounter: Payer: Self-pay | Admitting: Obstetrics & Gynecology

## 2011-06-26 ENCOUNTER — Ambulatory Visit (INDEPENDENT_AMBULATORY_CARE_PROVIDER_SITE_OTHER): Payer: Medicaid Other | Admitting: Obstetrics & Gynecology

## 2011-06-26 VITALS — BP 113/69 | HR 72 | Ht <= 58 in | Wt 142.0 lb

## 2011-06-26 DIAGNOSIS — N898 Other specified noninflammatory disorders of vagina: Secondary | ICD-10-CM

## 2011-06-26 DIAGNOSIS — Z3042 Encounter for surveillance of injectable contraceptive: Secondary | ICD-10-CM

## 2011-06-26 DIAGNOSIS — Z3049 Encounter for surveillance of other contraceptives: Secondary | ICD-10-CM

## 2011-06-26 DIAGNOSIS — Z01812 Encounter for preprocedural laboratory examination: Secondary | ICD-10-CM

## 2011-06-26 LAB — POCT URINE PREGNANCY: Preg Test, Ur: NEGATIVE

## 2011-06-26 MED ORDER — MEDROXYPROGESTERONE ACETATE 150 MG/ML IM SUSP
150.0000 mg | INTRAMUSCULAR | Status: DC
  Administered 2011-06-26: 150 mg via INTRAMUSCULAR

## 2011-06-26 NOTE — Progress Notes (Signed)
Addended by: Barbara Cower on: 06/26/2011 03:12 PM   Modules accepted: Orders, Medications
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
## 2011-06-27 LAB — WET PREP, GENITAL: Yeast Wet Prep HPF POC: NONE SEEN

## 2011-06-28 LAB — URINE CULTURE
Colony Count: NO GROWTH
Organism ID, Bacteria: NO GROWTH

## 2011-08-02 ENCOUNTER — Encounter (HOSPITAL_COMMUNITY): Payer: Self-pay | Admitting: *Deleted

## 2011-08-02 ENCOUNTER — Emergency Department (HOSPITAL_COMMUNITY)
Admission: EM | Admit: 2011-08-02 | Discharge: 2011-08-02 | Disposition: A | Discharge status: Home / Self Care | Payer: Self-pay | Attending: Emergency Medicine | Admitting: Emergency Medicine

## 2011-08-02 DIAGNOSIS — Z87891 Personal history of nicotine dependence: Secondary | ICD-10-CM | POA: Insufficient documentation

## 2011-08-02 DIAGNOSIS — R109 Unspecified abdominal pain: Secondary | ICD-10-CM | POA: Insufficient documentation

## 2011-08-02 DIAGNOSIS — R10819 Abdominal tenderness, unspecified site: Secondary | ICD-10-CM | POA: Insufficient documentation

## 2011-08-02 LAB — DIFFERENTIAL
Lymphocytes Relative: 30 % (ref 12–46)
Lymphs Abs: 2.8 10*3/uL (ref 0.7–4.0)
Monocytes Absolute: 0.4 10*3/uL (ref 0.1–1.0)
Monocytes Relative: 4 % (ref 3–12)
Neutro Abs: 5.7 10*3/uL (ref 1.7–7.7)

## 2011-08-02 LAB — URINALYSIS, ROUTINE W REFLEX MICROSCOPIC
Ketones, ur: NEGATIVE mg/dL
Nitrite: NEGATIVE
Protein, ur: NEGATIVE mg/dL
Urobilinogen, UA: 0.2 mg/dL (ref 0.0–1.0)

## 2011-08-02 LAB — BASIC METABOLIC PANEL
BUN: 12 mg/dL (ref 6–23)
CO2: 24 mEq/L (ref 19–32)
Chloride: 104 mEq/L (ref 96–112)
Creatinine, Ser: 0.85 mg/dL (ref 0.50–1.10)

## 2011-08-02 LAB — CBC
HCT: 39.5 % (ref 36.0–46.0)
Hemoglobin: 13.7 g/dL (ref 12.0–15.0)
WBC: 9.2 10*3/uL (ref 4.0–10.5)

## 2011-08-02 LAB — URINE MICROSCOPIC-ADD ON

## 2011-08-02 LAB — POCT PREGNANCY, URINE: Preg Test, Ur: NEGATIVE

## 2011-08-02 MED ORDER — HYOSCYAMINE SULFATE CR 0.375 MG PO CP12: 0.3750 mg | ORAL_CAPSULE | Freq: Two times a day (BID) | ORAL | Status: DC | PRN

## 2011-08-02 MED ORDER — MORPHINE SULFATE 4 MG/ML IJ SOLN
4.0000 mg | Freq: Once | INTRAMUSCULAR | Status: AC
  Administered 2011-08-02: 4 mg via INTRAVENOUS
  Filled 2011-08-02: qty 1

## 2011-08-02 MED ORDER — KETOROLAC TROMETHAMINE 30 MG/ML IJ SOLN
30.0000 mg | Freq: Once | INTRAMUSCULAR | Status: AC
  Administered 2011-08-02: 30 mg via INTRAVENOUS
  Filled 2011-08-02: qty 1

## 2011-08-02 MED ORDER — ONDANSETRON HCL 4 MG/2ML IJ SOLN
4.0000 mg | Freq: Once | INTRAMUSCULAR | Status: AC
  Administered 2011-08-02: 4 mg via INTRAVENOUS
  Filled 2011-08-02: qty 2

## 2011-08-02 MED ORDER — HYDROMORPHONE HCL PF 1 MG/ML IJ SOLN
1.0000 mg | Freq: Once | INTRAMUSCULAR | Status: AC
  Administered 2011-08-02: 1 mg via INTRAVENOUS
  Filled 2011-08-02: qty 1

## 2011-08-02 NOTE — ED Notes (Signed)
The pt has abd pain for 30 minutes.  No nv or diarrhea.  lmp 2 days ago
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
## 2011-08-02 NOTE — ED Notes (Signed)
First meeting with patient. Patient resting at this time with NAD. Patient is post partum x 3 days. Family at bedside. Patient denies abdominal pain, N/V at this time.

## 2011-08-02 NOTE — ED Notes (Signed)
Patient with acute abdominal pain for 30 minutes prior to arrival.  Patient is 3 months postpartum.  Patient denies any nausea or vomiting or diarrhea.  Patient is tender upon palpation in upper left quadrant.  Patient is CAOx3.

## 2011-08-02 NOTE — Discharge Instructions (Signed)
Your blood and urine tests do not show any significant illness.  Use Levsin for recurrent pain.  He may also use ibuprofen 600 mg every 6 hours for pain.  Followup with your Dr. if your symptoms.  Last more than 3-4 days.  Return for worse or uncontrolled symptoms

## 2011-09-06 IMAGING — US US OB NUCHAL TRANSLUCENCY 1ST GEST
1 series · 14 of 16 positions shown · non-contrast
Comparison: none

[Series 1: us ob nuchal translucency 1st gest · 0.14mm/px · 14 of 16 slices shown]
[im 1/16]
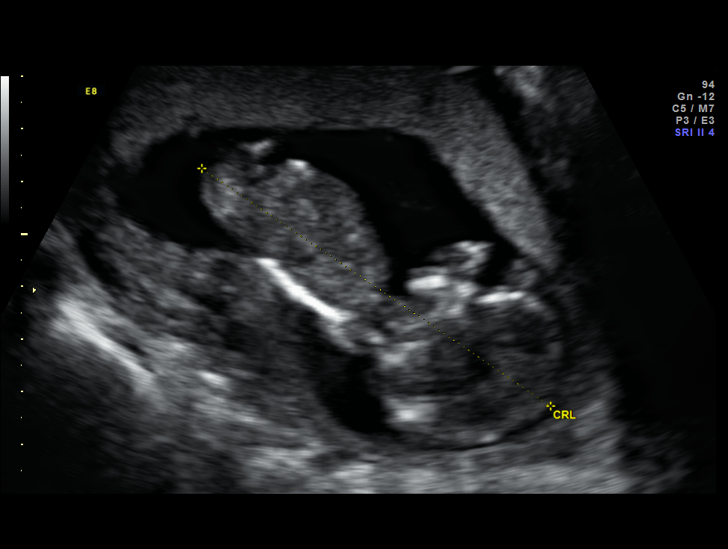
[im 2/16]
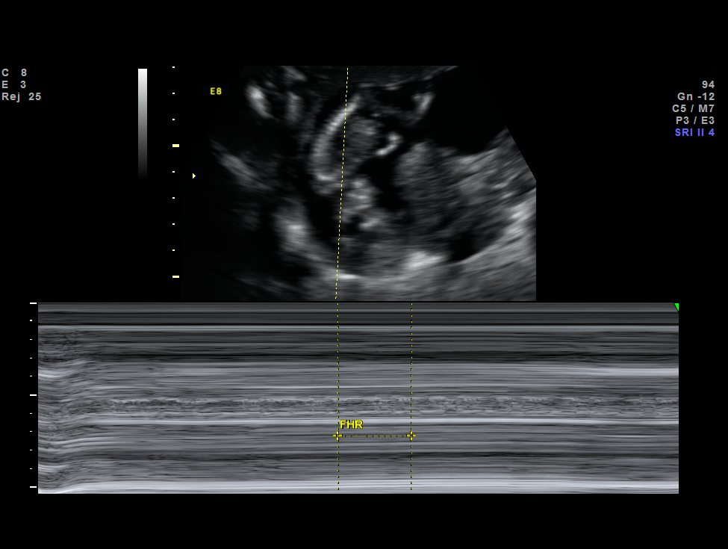
[im 3/16]
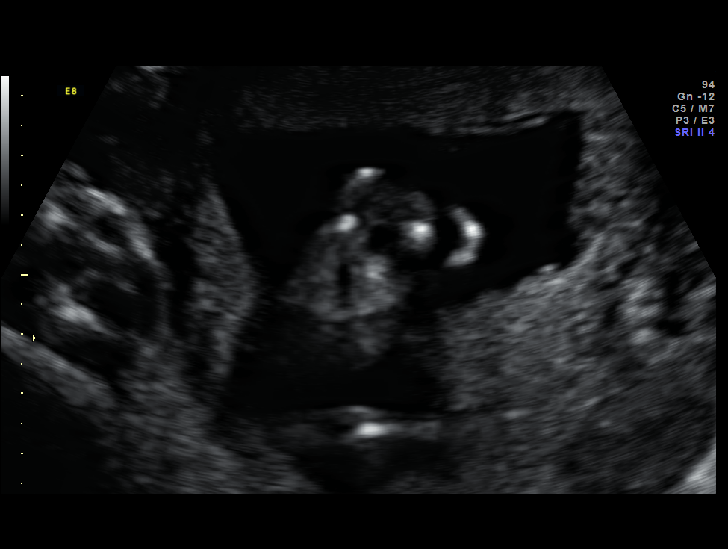
[im 5/16]
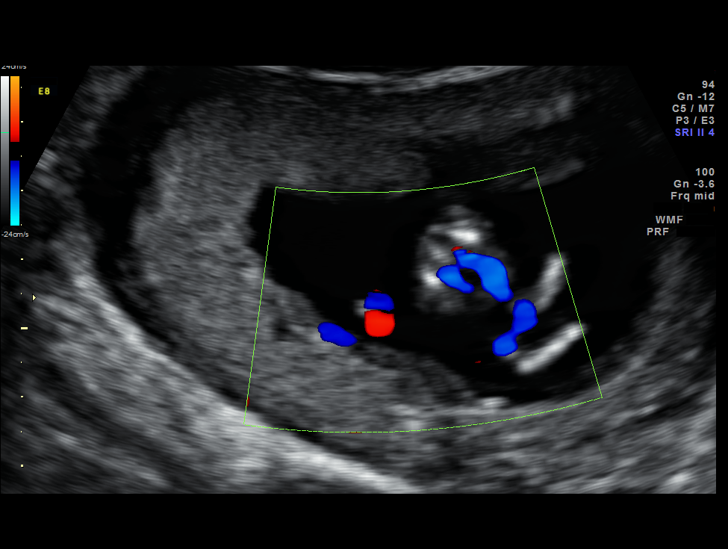
[im 6/16]
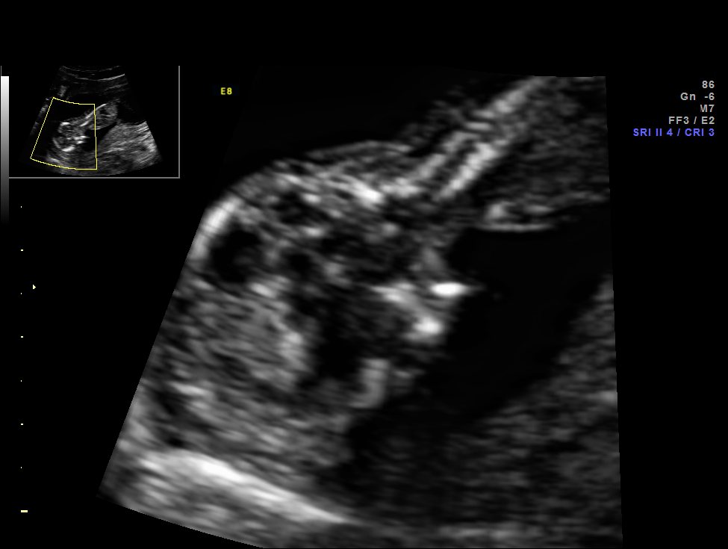
[im 7/16]
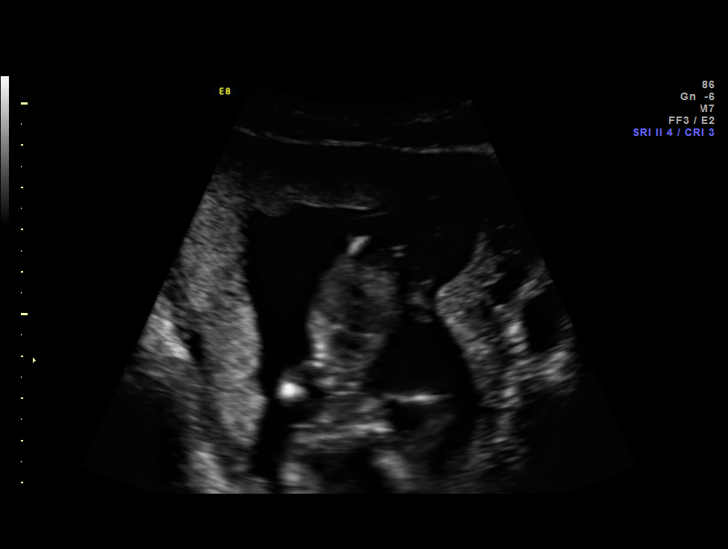
[im 8/16]
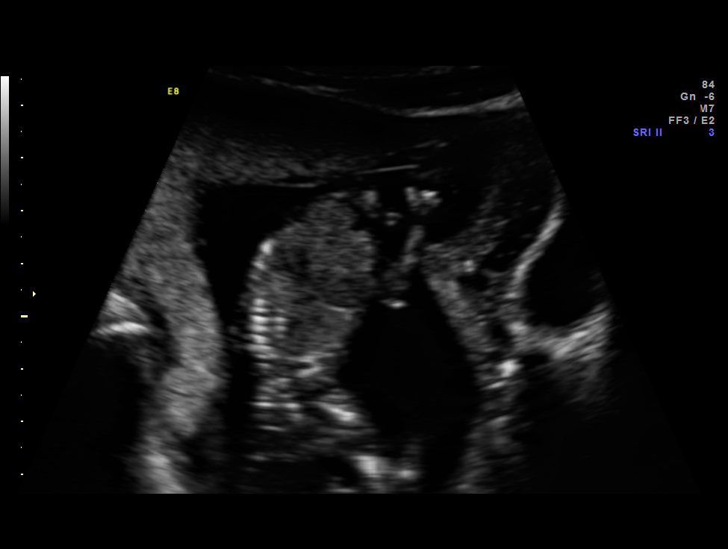
[im 9/16]
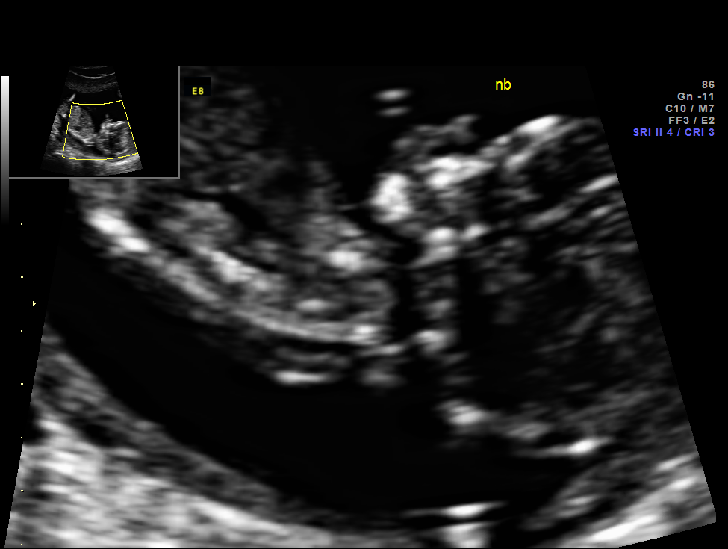
[im 10/16]
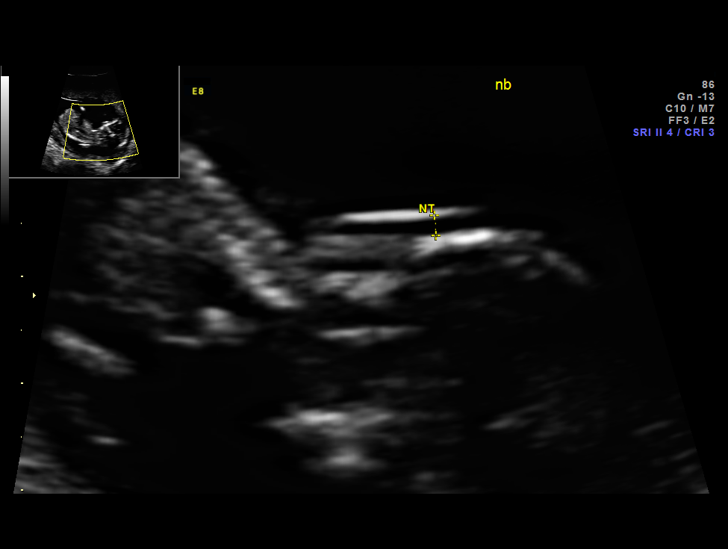
[im 11/16]
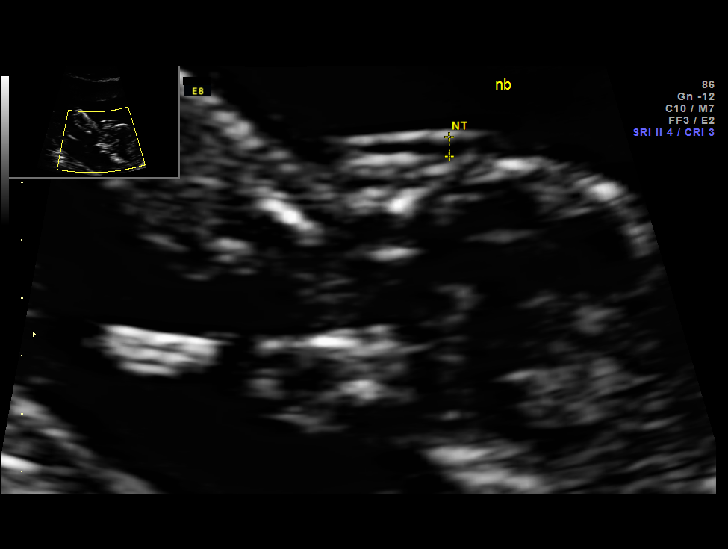
[im 13/16]
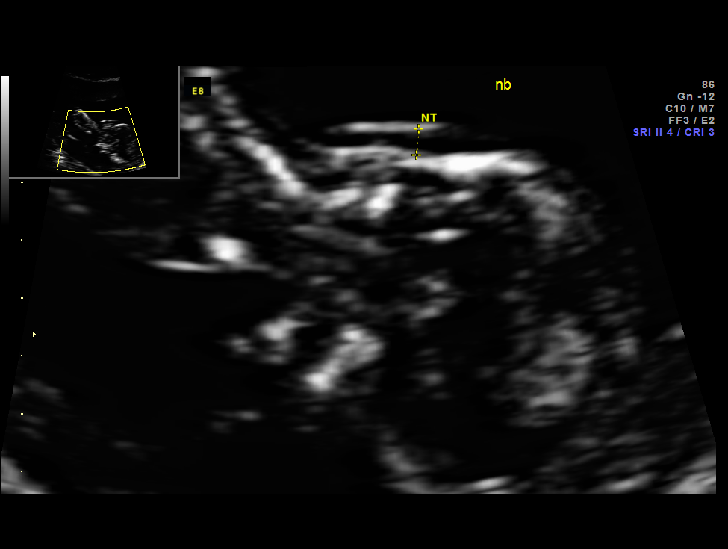
[im 14/16]
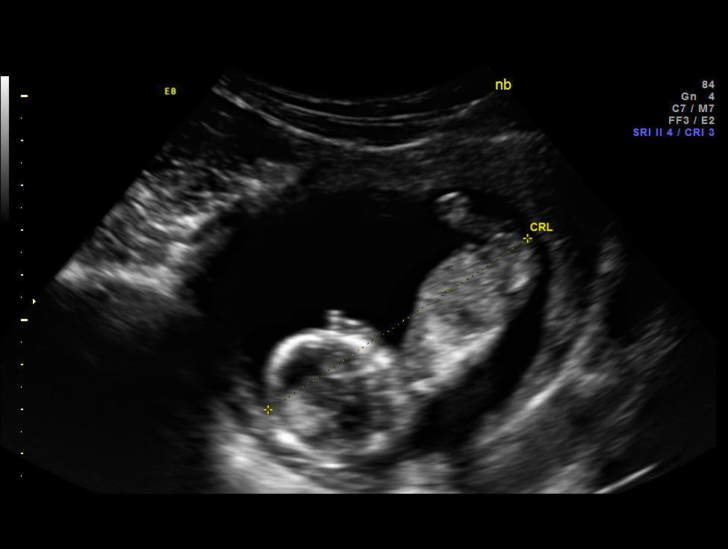
[im 15/16]
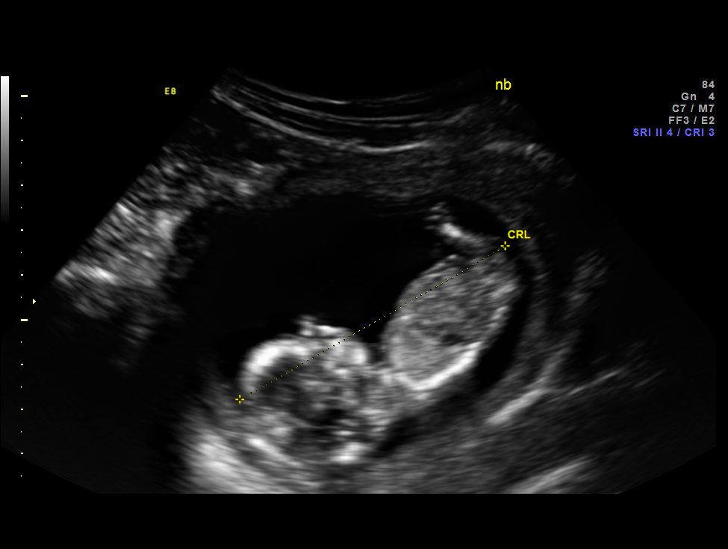
[im 16/16]
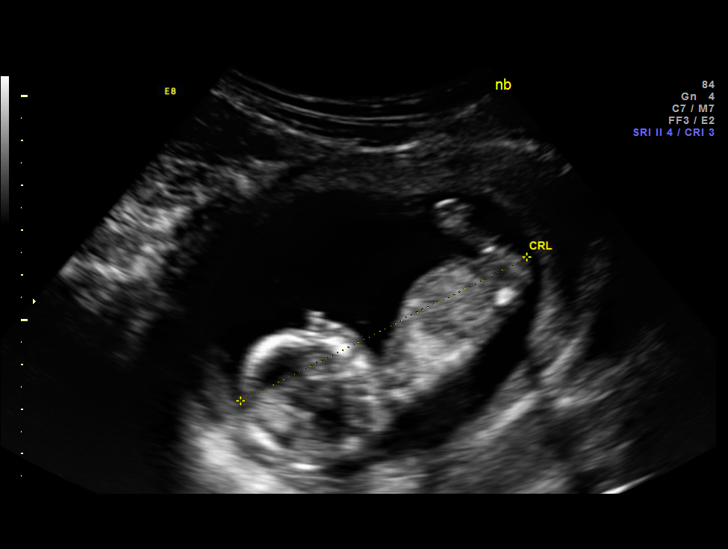

[14 of 16 positions shown; findings below may reference images not displayed]

Canned report from images found in remote index.

Refer to host system for actual result text.

## 2011-09-25 ENCOUNTER — Ambulatory Visit: Payer: Medicaid Other

## 2012-02-26 ENCOUNTER — Ambulatory Visit: Payer: Medicaid Other | Admitting: Obstetrics & Gynecology

## 2012-02-26 DIAGNOSIS — N912 Amenorrhea, unspecified: Secondary | ICD-10-CM

## 2012-08-07 ENCOUNTER — Encounter (HOSPITAL_COMMUNITY): Payer: Self-pay | Admitting: *Deleted

## 2012-08-07 ENCOUNTER — Emergency Department (INDEPENDENT_AMBULATORY_CARE_PROVIDER_SITE_OTHER)
Admission: EM | Admit: 2012-08-07 | Discharge: 2012-08-07 | Disposition: A | Discharge status: Home / Self Care | Payer: Self-pay | Source: Home / Self Care | Attending: Family Medicine | Admitting: Family Medicine

## 2012-08-07 DIAGNOSIS — K5289 Other specified noninfective gastroenteritis and colitis: Secondary | ICD-10-CM

## 2012-08-07 DIAGNOSIS — R110 Nausea: Secondary | ICD-10-CM

## 2012-08-07 DIAGNOSIS — K529 Noninfective gastroenteritis and colitis, unspecified: Secondary | ICD-10-CM

## 2012-08-07 DIAGNOSIS — R197 Diarrhea, unspecified: Secondary | ICD-10-CM

## 2012-08-07 MED ORDER — ONDANSETRON HCL 4 MG PO TABS: 4.0000 mg | ORAL_TABLET | Freq: Three times a day (TID) | ORAL | Status: DC | PRN

## 2012-08-07 MED ORDER — METRONIDAZOLE 500 MG PO TABS: 500.0000 mg | ORAL_TABLET | Freq: Two times a day (BID) | ORAL | Status: DC

## 2012-08-07 NOTE — ED Notes (Signed)
C/o diarrhea onset Thursday.  Diarrhea onset Thur. every 30 min to 1 hr.  Vomiting onset Fri. 3-4 times. Diarrhea x 10 Fri.  Vomited x 4 and D x 5-7 times today.   Has a little abdominal cramping. No chills or fever.  C/o nausea and dizziness.  Smells make her nauseated.
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
## 2012-08-07 NOTE — ED Provider Notes (Signed)
Medical screening examination/treatment/procedure(s) were performed by resident physician or non-physician practitioner and as supervising physician I was immediately available for consultation/collaboration.   Zyon Grout DOUGLAS MD.   Abdulmalik Darco D Patrycja Mumpower, MD 08/07/12 1726 

## 2012-10-21 ENCOUNTER — Ambulatory Visit: Payer: Self-pay | Admitting: Obstetrics & Gynecology

## 2012-10-26 ENCOUNTER — Emergency Department (HOSPITAL_COMMUNITY)
Admission: EM | Admit: 2012-10-26 | Discharge: 2012-10-26 | Disposition: A | Discharge status: Home / Self Care | Payer: Medicaid Other | Source: Home / Self Care | Attending: Family Medicine | Admitting: Family Medicine

## 2012-10-26 ENCOUNTER — Encounter (HOSPITAL_COMMUNITY): Payer: Self-pay | Admitting: *Deleted

## 2012-10-26 DIAGNOSIS — R110 Nausea: Secondary | ICD-10-CM

## 2012-10-26 DIAGNOSIS — Z3202 Encounter for pregnancy test, result negative: Secondary | ICD-10-CM

## 2012-10-26 LAB — POCT URINALYSIS DIP (DEVICE)
Glucose, UA: NEGATIVE mg/dL
Ketones, ur: NEGATIVE mg/dL
Specific Gravity, Urine: 1.02 (ref 1.005–1.030)
Urobilinogen, UA: 1 mg/dL (ref 0.0–1.0)

## 2012-10-26 LAB — POCT PREGNANCY, URINE: Preg Test, Ur: NEGATIVE
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
## 2012-10-26 NOTE — ED Notes (Signed)
Pt   Reports  Symptoms  Of        Nausea             irreg      Periods    And   Feels  The  Way     She  Felt  When  She  Was  Pregnant  Before

## 2013-04-14 HISTORY — PX: INDUCED ABORTION: SHX677

## 2013-05-17 ENCOUNTER — Ambulatory Visit (INDEPENDENT_AMBULATORY_CARE_PROVIDER_SITE_OTHER): Payer: Medicaid Other | Admitting: Obstetrics & Gynecology

## 2013-05-17 ENCOUNTER — Other Ambulatory Visit (HOSPITAL_COMMUNITY)
Admission: RE | Admit: 2013-05-17 | Discharge: 2013-05-17 | Disposition: A | Discharge status: Home / Self Care | Payer: Medicaid Other | Source: Ambulatory Visit | Attending: Obstetrics & Gynecology | Admitting: Obstetrics & Gynecology

## 2013-05-17 ENCOUNTER — Encounter: Payer: Self-pay | Admitting: Obstetrics & Gynecology

## 2013-05-17 VITALS — BP 112/82 | HR 98 | Ht <= 58 in | Wt 121.0 lb

## 2013-05-17 DIAGNOSIS — Z113 Encounter for screening for infections with a predominantly sexual mode of transmission: Secondary | ICD-10-CM | POA: Insufficient documentation

## 2013-05-17 DIAGNOSIS — Z23 Encounter for immunization: Secondary | ICD-10-CM

## 2013-05-17 DIAGNOSIS — Z01419 Encounter for gynecological examination (general) (routine) without abnormal findings: Secondary | ICD-10-CM

## 2013-05-17 DIAGNOSIS — Z0000 Encounter for general adult medical examination without abnormal findings: Secondary | ICD-10-CM

## 2013-05-17 NOTE — Addendum Note (Signed)
Addended by: Barbara CowerNOGUES, Olean Sangster L on: 05/17/2013 01:47 PM   Modules accepted: Orders

## 2013-05-17 NOTE — Patient Instructions (Signed)
Etonogestrel implant What is this medicine? ETONOGESTREL (et oh noe JES trel) is a contraceptive (birth control) device. It is used to prevent pregnancy. It can be used for up to 3 years. This medicine may be used for other purposes; ask your health care provider or pharmacist if you have questions. COMMON BRAND NAME(S): Implanon, Nexplanon  What should I tell my health care provider before I take this medicine? They need to know if you have any of these conditions: -abnormal vaginal bleeding -blood vessel disease or blood clots -cancer of the breast, cervix, or liver -depression -diabetes -gallbladder disease -headaches -heart disease or recent heart attack -high blood pressure -high cholesterol -kidney disease -liver disease -renal disease -seizures -tobacco smoker -an unusual or allergic reaction to etonogestrel, other hormones, anesthetics or antiseptics, medicines, foods, dyes, or preservatives -pregnant or trying to get pregnant -breast-feeding How should I use this medicine? This device is inserted just under the skin on the inner side of your upper arm by a health care professional. Talk to your pediatrician regarding the use of this medicine in children. Special care may be needed. Overdosage: If you think you've taken too much of this medicine contact a poison control center or emergency room at once. Overdosage: If you think you have taken too much of this medicine contact a poison control center or emergency room at once. NOTE: This medicine is only for you. Do not share this medicine with others. What if I miss a dose? This does not apply. What may interact with this medicine? Do not take this medicine with any of the following medications: -amprenavir -bosentan -fosamprenavir This medicine may also interact with the following medications: -barbiturate medicines for inducing sleep or treating seizures -certain medicines for fungal infections like ketoconazole and  itraconazole -griseofulvin -medicines to treat seizures like carbamazepine, felbamate, oxcarbazepine, phenytoin, topiramate -modafinil -phenylbutazone -rifampin -some medicines to treat HIV infection like atazanavir, indinavir, lopinavir, nelfinavir, tipranavir, ritonavir -St. John's wort This list may not describe all possible interactions. Give your health care provider a list of all the medicines, herbs, non-prescription drugs, or dietary supplements you use. Also tell them if you smoke, drink alcohol, or use illegal drugs. Some items may interact with your medicine. What should I watch for while using this medicine? This product does not protect you against HIV infection (AIDS) or other sexually transmitted diseases. You should be able to feel the implant by pressing your fingertips over the skin where it was inserted. Tell your doctor if you cannot feel the implant. What side effects may I notice from receiving this medicine? Side effects that you should report to your doctor or health care professional as soon as possible: -allergic reactions like skin rash, itching or hives, swelling of the face, lips, or tongue -breast lumps -changes in vision -confusion, trouble speaking or understanding -dark urine -depressed mood -general ill feeling or flu-like symptoms -light-colored stools -loss of appetite, nausea -right upper belly pain -severe headaches -severe pain, swelling, or tenderness in the abdomen -shortness of breath, chest pain, swelling in a leg -signs of pregnancy -sudden numbness or weakness of the face, arm or leg -trouble walking, dizziness, loss of balance or coordination -unusual vaginal bleeding, discharge -unusually weak or tired -yellowing of the eyes or skin Side effects that usually do not require medical attention (Report these to your doctor or health care professional if they continue or are bothersome.): -acne -breast pain -changes in  weight -cough -fever or chills -headache -irregular menstrual bleeding -itching, burning,   and vaginal discharge -pain or difficulty passing urine -sore throat This list may not describe all possible side effects. Call your doctor for medical advice about side effects. You may report side effects to FDA at 1-800-FDA-1088. Where should I keep my medicine? This drug is given in a hospital or clinic and will not be stored at home. NOTE: This sheet is a summary. It may not cover all possible information. If you have questions about this medicine, talk to your doctor, pharmacist, or health care provider.  2014, [employer] Standard. (2011-10-06 15:37:45)  

## 2013-05-17 NOTE — Progress Notes (Signed)
Subjective:    Kristen Coffey is a 23 y.o. female who presents for an annual exam. The patient has no complaints today. She would like to schedule a Nexplanon insertion soon. She is using condoms currently. The patient is sexually active. GYN screening history: last pap: was normal. The patient wears seatbelts: yes. The patient participates in regular exercise: yes. Has the patient ever been transfused or tattooed?: yes. The patient reports that there is not domestic violence in her life.   Menstrual History: OB History   Grav Para Term Preterm Abortions TAB SAB Ect Mult Living   2 1 0 1 1 1 0 0 0 1       Menarche age: 89  Co[place]: 6812   Patient's last menstrual period was 05/10/2013.    The following portions of the patient's history were reviewed and updated as appropriate: allergies, current medications, past family history, past medical history, past social history, past surgical history and problem list.  Review of Systems A comprehensive review of systems was negative. Monogamous as far as she knows for a year. Working at [employer]. Wants flu vaccine today. She had a surgical abortion 2 weeks ago [place].   Objective:    BP 112/82  Pulse 98  Ht 4\' 8"  (1.422 m)  Wt 121 lb (54.885 kg)  BMI 27.14 kg/m2  LMP 05/10/2013  General Appearance:    Alert, cooperative, no distress, appears stated age  Head:    Normocephalic, without obvious abnormality, atraumatic  Eyes:    PERRL, conjunctiva/corneas clear, EOM's intact, fundi    benign, both eyes  Ears:    Normal TM's and external ear canals, both ears  Nose:   Nares normal, septum midline, mucosa normal, no drainage    or sinus tenderness  Throat:   Lips, mucosa, and tongue normal; teeth and gums normal  Neck:   Supple, symmetrical, trachea midline, no adenopathy;    thyroid:  no enlargement/tenderness/nodules; no carotid   bruit or JVD  Back:     Symmetric, no curvature, ROM normal, no CVA tenderness   Lungs:     Clear to auscultation bilaterally, respirations unlabored  Chest Wall:    No tenderness or deformity   Heart:    Regular rate and rhythm, S1 and S2 normal, no murmur, rub   or gallop  Breast Exam:    No tenderness, masses, or nipple abnormality  Abdomen:     Soft, non-tender, bowel sounds active all four quadrants,    no masses, no organomegaly  Genitalia:    Normal female without lesion, discharge or tenderness, NSSA, NT, mobile, normal adnexal exam     Extremities:   Extremities normal, atraumatic, no cyanosis or edema  Pulses:   2+ and symmetric all extremities  Skin:   Skin color, texture, turgor normal, no rashes or lesions  Lymph nodes:   Cervical, supraclavicular, and axillary nodes normal  Neurologic:   CNII-XII intact, normal strength, sensation and reflexes    throughout  .    Assessment:    Healthy female exam.    Plan:     Breast self exam technique reviewed and patient encouraged to perform self-exam monthly. Chlamydia specimen. GC specimen. Thin prep Pap smear.  Schedule Nexplanon next wee

## 2013-05-18 LAB — HEPATITIS B SURFACE ANTIGEN: HEP B S AG: NEGATIVE

## 2013-05-18 LAB — RPR

## 2013-05-18 LAB — HEPATITIS C ANTIBODY: HCV Ab: NEGATIVE

## 2013-05-18 LAB — HIV ANTIBODY (ROUTINE TESTING W REFLEX): HIV: NONREACTIVE

## 2013-05-26 ENCOUNTER — Ambulatory Visit (INDEPENDENT_AMBULATORY_CARE_PROVIDER_SITE_OTHER): Payer: Medicaid Other | Admitting: Obstetrics & Gynecology

## 2013-05-26 ENCOUNTER — Encounter: Payer: Self-pay | Admitting: Obstetrics & Gynecology

## 2013-05-26 VITALS — BP 120/83 | HR 86 | Ht <= 58 in | Wt 118.0 lb

## 2013-05-26 DIAGNOSIS — Z01812 Encounter for preprocedural laboratory examination: Secondary | ICD-10-CM

## 2013-05-26 DIAGNOSIS — Z30430 Encounter for insertion of intrauterine contraceptive device: Secondary | ICD-10-CM

## 2013-05-26 LAB — POCT URINE PREGNANCY: PREG TEST UR: NEGATIVE

## 2013-05-26 NOTE — Progress Notes (Signed)
   Subjective:    Patient ID: Kristen Coffey, female    DOB: 11/12/1990, 23 y.o.   MRN: [id number]  HPI  23 yo lady here for IUD insertion. We discussed Nexplanon as well. She understands that irregular bleeding is not uncommon with both of these methods. I also counseled her about STIs and using condoms for STI protection.  Review of Systems     Objective:   Physical Exam UPT negative, consent signed, Time out procedure done. Cervix prepped with betadine. Mirena was easily placed and the strings were cut to 3-4 cm. Uterus sounded to 9 cm. She tolerated the procedure well.         Assessment & Plan:   Contraception- Mirena RTC 4 weeks for string check

## 2013-05-26 NOTE — Patient Instructions (Signed)
Levonorgestrel intrauterine device (IUD) What is this medicine? LEVONORGESTREL IUD (LEE voe nor jes trel) is a contraceptive (birth control) device. The device is placed inside the uterus by a healthcare professional. It is used to prevent pregnancy and can also be used to treat heavy bleeding that occurs during your period. Depending on the device, it can be used for 3 to 5 years. This medicine may be used for other purposes; ask your health care provider or pharmacist if you have questions. COMMON BRAND NAME(S): Mirena, Skyla What should I tell my health care provider before I take this medicine? They need to know if you have any of these conditions: -abnormal Pap smear -cancer of the breast, uterus, or cervix -diabetes -endometritis -genital or pelvic infection now or in the past -have more than one sexual partner or your partner has more than one partner -heart disease -history of an ectopic or tubal pregnancy -immune system problems -IUD in place -liver disease or tumor -problems with blood clots or take blood-thinners -use intravenous drugs -uterus of unusual shape -vaginal bleeding that has not been explained -an unusual or allergic reaction to levonorgestrel, other hormones, silicone, or polyethylene, medicines, foods, dyes, or preservatives -pregnant or trying to get pregnant -breast-feeding How should I use this medicine? This device is placed inside the uterus by a health care professional. Talk to your pediatrician regarding the use of this medicine in children. Special care may be needed. Overdosage: If you think you have taken too much of this medicine contact a poison control center or emergency room at once. NOTE: This medicine is only for you. Do not share this medicine with others. What if I miss a dose? This does not apply. What may interact with this medicine? Do not take this medicine with any of the following  medications: -amprenavir -bosentan -fosamprenavir This medicine may also interact with the following medications: -aprepitant -barbiturate medicines for inducing sleep or treating seizures -bexarotene -griseofulvin -medicines to treat seizures like carbamazepine, ethotoin, felbamate, oxcarbazepine, phenytoin, topiramate -modafinil -pioglitazone -rifabutin -rifampin -rifapentine -some medicines to treat HIV infection like atazanavir, indinavir, lopinavir, nelfinavir, tipranavir, ritonavir -St. John's wort -warfarin This list may not describe all possible interactions. Give your health care provider a list of all the medicines, herbs, non-prescription drugs, or dietary supplements you use. Also tell them if you smoke, drink alcohol, or use illegal drugs. Some items may interact with your medicine. What should I watch for while using this medicine? Visit your doctor or health care professional for regular check ups. See your doctor if you or your partner has sexual contact with others, becomes HIV positive, or gets a sexual transmitted disease. This product does not protect you against HIV infection (AIDS) or other sexually transmitted diseases. You can check the placement of the IUD yourself by reaching up to the top of your vagina with clean fingers to feel the threads. Do not pull on the threads. It is a good habit to check placement after each menstrual period. Call your doctor right away if you feel more of the IUD than just the threads or if you cannot feel the threads at all. The IUD may come out by itself. You may become pregnant if the device comes out. If you notice that the IUD has come out use a backup birth control method like condoms and call your health care provider. Using tampons will not change the position of the IUD and are okay to use during your period. What side effects may I   notice from receiving this medicine? Side effects that you should report to your doctor or  health care professional as soon as possible: -allergic reactions like skin rash, itching or hives, swelling of the face, lips, or tongue -fever, flu-like symptoms -genital sores -high blood pressure -no menstrual period for 6 weeks during use -pain, swelling, warmth in the leg -pelvic pain or tenderness -severe or sudden headache -signs of pregnancy -stomach cramping -sudden shortness of breath -trouble with balance, talking, or walking -unusual vaginal bleeding, discharge -yellowing of the eyes or skin Side effects that usually do not require medical attention (report to your doctor or health care professional if they continue or are bothersome): -acne -breast pain -change in sex drive or performance -changes in weight -cramping, dizziness, or faintness while the device is being inserted -headache -irregular menstrual bleeding within first 3 to 6 months of use -nausea This list may not describe all possible side effects. Call your doctor for medical advice about side effects. You may report side effects to FDA at 1-800-FDA-1088. Where should I keep my medicine? This does not apply. NOTE: This sheet is a summary. It may not cover all possible information. If you have questions about this medicine, talk to your doctor, pharmacist, or health care provider.  2014, [employer] Standard. (2011-05-01 13:54:04)  

## 2014-02-13 ENCOUNTER — Encounter: Payer: Self-pay | Admitting: Obstetrics & Gynecology
